# Patient Record
Sex: Male | Born: 1945 | Race: Black or African American | Hispanic: No | Marital: Married | Smoking: Never smoker
Health system: Southern US, Community
[De-identification: ages and names within clinical notes are randomized; demographics above are authoritative.]

## PROBLEM LIST (undated history)

## (undated) DIAGNOSIS — M109 Gout, unspecified: Secondary | ICD-10-CM

## (undated) DIAGNOSIS — I10 Essential (primary) hypertension: Secondary | ICD-10-CM

## (undated) DIAGNOSIS — K219 Gastro-esophageal reflux disease without esophagitis: Secondary | ICD-10-CM

## (undated) DIAGNOSIS — M1990 Unspecified osteoarthritis, unspecified site: Secondary | ICD-10-CM

## (undated) DIAGNOSIS — N183 Chronic kidney disease, stage 3 unspecified: Secondary | ICD-10-CM

## (undated) DIAGNOSIS — N400 Enlarged prostate without lower urinary tract symptoms: Secondary | ICD-10-CM

## (undated) DIAGNOSIS — I2510 Atherosclerotic heart disease of native coronary artery without angina pectoris: Secondary | ICD-10-CM

## (undated) DIAGNOSIS — E785 Hyperlipidemia, unspecified: Secondary | ICD-10-CM

## (undated) DIAGNOSIS — J42 Unspecified chronic bronchitis: Secondary | ICD-10-CM

## (undated) DIAGNOSIS — H409 Unspecified glaucoma: Secondary | ICD-10-CM

## (undated) DIAGNOSIS — E119 Type 2 diabetes mellitus without complications: Secondary | ICD-10-CM

## (undated) HISTORY — PX: EXPLORATION POST OPERATIVE OPEN HEART: SHX5061

## (undated) HISTORY — PX: CORONARY ARTERY BYPASS GRAFT: SHX141

## (undated) HISTORY — PX: PACEMAKER IMPLANT: EP1218

---

## 2008-09-26 LAB — METABOLIC PANEL, COMPREHENSIVE
A-G Ratio: 0.9 (ref 0.8–1.7)
ALT (SGPT): 37 U/L (ref 30–65)
AST (SGOT): 12 U/L — ABNORMAL LOW (ref 15–37)
Albumin: 3.9 g/dL (ref 3.4–5.0)
Alk. phosphatase: 74 U/L (ref 50–136)
Anion gap: 9 mmol/L (ref 5–15)
BUN/Creatinine ratio: 14 (ref 12–20)
BUN: 19 MG/DL — ABNORMAL HIGH (ref 7–18)
Bilirubin, total: 0.5 MG/DL (ref 0.1–0.9)
CO2: 30 MMOL/L (ref 21–32)
Calcium: 9.6 MG/DL (ref 8.4–10.4)
Chloride: 100 MMOL/L (ref 100–108)
Creatinine: 1.4 MG/DL — ABNORMAL HIGH (ref 0.6–1.3)
GFR est AA: >60 mL/min/{1.73_m2} (ref >60)
GFR est non-AA: 54 mL/min/{1.73_m2} — ABNORMAL LOW (ref >60)
Globulin: 4.2 g/dL — ABNORMAL HIGH (ref 2.0–4.0)
Glucose: 182 MG/DL — ABNORMAL HIGH (ref 74–99)
Potassium: 3.9 MMOL/L (ref 3.5–5.5)
Protein, total: 8.1 g/dL (ref 6.4–8.2)
Sodium: 139 MMOL/L (ref 136–145)

## 2008-09-26 LAB — CBC WITH AUTOMATED DIFF
ABS. LYMPHOCYTES: 2.3 10*3/uL (ref 0.8–3.5)
ABS. MONOCYTES: 1 10*3/uL (ref 0–1.0)
ABS. NEUTROPHILS: 8.9 10*3/uL — ABNORMAL HIGH (ref 1.8–8.0)
BASOPHILS: 1 % (ref 0–3)
EOSINOPHILS: 0 % (ref 0–5)
HCT: 42.7 % (ref 37.0–49.0)
HGB: 14.2 g/dL (ref 13.0–16.0)
LYMPHOCYTES: 19 % — ABNORMAL LOW (ref 20–51)
MCH: 29.6 PG (ref 25.0–35.0)
MCHC: 33.3 g/dL (ref 31.0–37.0)
MCV: 88.7 FL (ref 78.0–98.0)
MONOCYTES: 9 % (ref 2–9)
MPV: 8.5 FL (ref 7.4–10.4)
NEUTROPHILS: 71 % (ref 42–75)
PLATELET: 210 10*3/uL (ref 130–400)
RBC: 4.81 M/uL (ref 4.50–5.30)
RDW: 14.2 % (ref 11.5–14.5)
WBC: 12.3 10*3/uL (ref 4.5–13.0)

## 2008-09-26 LAB — URINALYSIS W/ RFLX MICROSCOPIC
Bilirubin: NEGATIVE
Glucose: NEGATIVE MG/DL
Ketone: NEGATIVE MG/DL
Leukocyte Esterase: NEGATIVE
Nitrites: NEGATIVE
Protein: NEGATIVE MG/DL
Specific gravity: 1.025 (ref 1.003–1.030)
Urobilinogen: 0.2 EU/DL (ref 0.2–1.0)
pH (UA): 6 (ref 5.0–8.0)

## 2008-09-26 LAB — URINE MICROSCOPIC ONLY
RBC: 0 /HPF (ref 0–5)
WBC: NEGATIVE /HPF (ref 0–5)

## 2008-09-26 LAB — GLUCOSE, POC: Glucose (POC): 137 mg/dL — ABNORMAL HIGH (ref 70–110)

## 2008-09-26 LAB — ACETONE/KETONE, QL: Acetone/Ketone serum, QL.: NEGATIVE

## 2008-09-27 LAB — GLUCOSE, POC: Glucose (POC): 195 mg/dL — ABNORMAL HIGH (ref 70–110)

## 2010-10-08 LAB — POTASSIUM: Potassium: 4.1 MMOL/L (ref 3.5–5.5)

## 2013-08-21 LAB — AMB EXT PSA

## 2013-08-21 LAB — AMB EXT LDL-C

## 2014-03-12 NOTE — Progress Notes (Signed)
Abstraction encounter from 10/16/13 entered in error.

## 2014-03-12 NOTE — Addendum Note (Signed)
Addended by: Tiffany Kocher on: 03/12/2014 03:23 PM      Modules accepted: Orders, Medications

## 2014-12-08 ENCOUNTER — Inpatient Hospital Stay
Admit: 2014-12-08 | Discharge: 2014-12-09 | Disposition: A | Discharge status: Home / Self Care | Payer: MEDICARE | Attending: Emergency Medicine

## 2014-12-08 DIAGNOSIS — E1165 Type 2 diabetes mellitus with hyperglycemia: Secondary | ICD-10-CM

## 2014-12-08 LAB — CBC WITH AUTOMATED DIFF
ABS. BASOPHILS: 0 10*3/uL (ref 0.0–0.06)
ABS. EOSINOPHILS: 0 10*3/uL (ref 0.0–0.4)
ABS. LYMPHOCYTES: 1.4 10*3/uL (ref 0.9–3.6)
ABS. MONOCYTES: 0.8 10*3/uL (ref 0.05–1.2)
ABS. NEUTROPHILS: 18 10*3/uL — ABNORMAL HIGH (ref 1.8–8.0)
BASOPHILS: 0 % (ref 0–2)
EOSINOPHILS: 0 % (ref 0–5)
HCT: 40 % (ref 36.0–48.0)
HGB: 13.6 g/dL (ref 13.0–16.0)
LYMPHOCYTES: 7 % — ABNORMAL LOW (ref 21–52)
MCH: 29.2 PG (ref 24.0–34.0)
MCHC: 34 g/dL (ref 31.0–37.0)
MCV: 85.8 FL (ref 74.0–97.0)
MONOCYTES: 4 % (ref 3–10)
MPV: 9.7 FL (ref 9.2–11.8)
NEUTROPHILS: 89 % — ABNORMAL HIGH (ref 40–73)
PLATELET: 243 10*3/uL (ref 135–420)
RBC: 4.66 M/uL — ABNORMAL LOW (ref 4.70–5.50)
RDW: 14.2 % (ref 11.6–14.5)
WBC: 20.2 10*3/uL — ABNORMAL HIGH (ref 4.6–13.2)

## 2014-12-08 LAB — METABOLIC PANEL, BASIC
Anion gap: 12 mmol/L (ref 3.0–18)
BUN/Creatinine ratio: 14 (ref 12–20)
BUN: 29 MG/DL — ABNORMAL HIGH (ref 7.0–18)
CO2: 21 mmol/L (ref 21–32)
Calcium: 9.5 MG/DL (ref 8.5–10.1)
Chloride: 100 mmol/L (ref 100–108)
Creatinine: 2.03 MG/DL — ABNORMAL HIGH (ref 0.6–1.3)
GFR est AA: 40 mL/min/{1.73_m2} — ABNORMAL LOW (ref >60)
GFR est non-AA: 33 mL/min/{1.73_m2} — ABNORMAL LOW (ref >60)
Glucose: 330 mg/dL — ABNORMAL HIGH (ref 74–99)
Potassium: 4.7 mmol/L (ref 3.5–5.5)
Sodium: 133 mmol/L — ABNORMAL LOW (ref 136–145)

## 2014-12-08 LAB — CARDIAC PANEL,(CK, CKMB & TROPONIN)
CK - MB: 1.5 ng/ml (ref 0.5–3.6)
CK-MB Index: 1.8 % (ref 0.0–4.0)
CK: 84 U/L (ref 39–308)
Troponin-I, QT: 0.02 NG/ML (ref 0.00–0.06)

## 2014-12-08 LAB — GLUCOSE, POC
Glucose (POC): 183 mg/dL — ABNORMAL HIGH (ref 70–110)
Glucose (POC): 267 mg/dL — ABNORMAL HIGH (ref 70–110)
Glucose (POC): 304 mg/dL — ABNORMAL HIGH (ref 70–110)

## 2014-12-08 MED ORDER — INSULIN REGULAR HUMAN 100 UNIT/ML INJECTION
100 unit/mL | INTRAMUSCULAR | Status: AC
Start: 2014-12-08 — End: 2014-12-08
  Administered 2014-12-08: 22:00:00 via INTRAVENOUS

## 2014-12-08 MED ORDER — SODIUM CHLORIDE 0.9% BOLUS IV
0.9 % | Freq: Once | INTRAVENOUS | Status: AC
Start: 2014-12-08 — End: 2014-12-08
  Administered 2014-12-08: 22:00:00 via INTRAVENOUS

## 2014-12-08 MED FILL — SODIUM CHLORIDE 0.9 % IV: INTRAVENOUS | Qty: 1000

## 2014-12-08 MED FILL — HUMULIN R REGULAR U-100 INSULIN 100 UNIT/ML INJECTION SOLUTION: 100 unit/mL | INTRAMUSCULAR | Qty: 3

## 2014-12-08 NOTE — ED Provider Notes (Signed)
HPI Comments:   5:12 PM  Martin DalesRobert E Decandia is a 69 y.o. male with hx of DM, HTN, chronic kidney disease, and CHF who presents to the ED via spouse C/O high blood sugar. Pt has had a persistent cough since January and recently was prescribed a steroid by his Pulmonologist (Dr. Pleas Patriciarie) to treat this. After taking 6 tablets 2x per day for 5 days pt has noticed his blood sugar has been elevated. Pt denies any other Sx or complaints.      Written by Buelah Manisaniel Hoock, ED Scribe, as dictated by Chelsea PrimusMark Jarnell Cordaro, MD               Patient is a 69 y.o. male presenting with hyperglycemia. The history is provided by the patient and the spouse. No language interpreter was used.   High Blood Sugar   This is a new problem. The current episode started more than 2 days ago. The problem occurs constantly. Pertinent negatives include no fever, no diarrhea, no nausea, no vomiting, no dysuria, no headaches, no arthralgias, no myalgias and no chest pain.        Past Medical History:   Diagnosis Date   ??? Hypertension    ??? Diabetes (HCC)    ??? Heart failure (HCC)    ??? Glaucoma    ??? Chronic kidney disease        Past Surgical History:   Procedure Laterality Date   ??? Hx coronary artery bypass graft           History reviewed. No pertinent family history.    History     Social History   ??? Marital Status: MARRIED     Spouse Name: N/A   ??? Number of Children: N/A   ??? Years of Education: N/A     Occupational History   ??? Not on file.     Social History Main Topics   ??? Smoking status: Never Smoker    ??? Smokeless tobacco: Not on file   ??? Alcohol Use: 0.0 oz/week     0 Standard drinks or equivalent per week   ??? Drug Use: No   ??? Sexual Activity: Not on file     Other Topics Concern   ??? Not on file     Social History Narrative           ALLERGIES: Pcn      Review of Systems   Constitutional: Negative for fever and fatigue.   HENT: Negative for rhinorrhea and sore throat.    Respiratory: Positive for cough. Negative for shortness of breath.     Cardiovascular: Negative for chest pain and palpitations.   Gastrointestinal: Negative for nausea, vomiting, abdominal pain and diarrhea.   Genitourinary: Negative for dysuria and difficulty urinating.   Musculoskeletal: Negative for myalgias and arthralgias.   Skin: Negative for color change and rash.   Neurological: Negative for light-headedness and headaches.   All other systems reviewed and are negative.      Filed Vitals:    12/08/14 1645 12/08/14 1700 12/08/14 1715 12/08/14 1730   BP: 148/76 142/75 148/76 135/75   Pulse: 98 77 75 72   Temp:       Resp: 19 21 11 19    Height:       Weight:       SpO2: 98% 96% 99% 98%            Physical Exam   Constitutional: He is oriented to person, place, and time. He  appears well-developed and well-nourished. No distress.   HENT:   Head: Normocephalic and atraumatic. Head is without right periorbital erythema and without left periorbital erythema.   Right Ear: External ear normal. No drainage or swelling. Tympanic membrane is not perforated, not erythematous and not bulging.   Left Ear: External ear normal. No drainage or swelling. Tympanic membrane is not perforated, not erythematous and not bulging.   Nose: Nose normal. No mucosal edema or rhinorrhea. Right sinus exhibits no maxillary sinus tenderness and no frontal sinus tenderness. Left sinus exhibits no maxillary sinus tenderness and no frontal sinus tenderness.   Mouth/Throat: Uvula is midline, oropharynx is clear and moist and mucous membranes are normal. No oral lesions. No trismus in the jaw. No dental abscesses or uvula swelling. No oropharyngeal exudate, posterior oropharyngeal edema, posterior oropharyngeal erythema or tonsillar abscesses.   Eyes: Conjunctivae are normal. Right eye exhibits no discharge. Left eye exhibits no discharge. No scleral icterus.   Neck: Normal range of motion. Neck supple.   Cardiovascular: Normal rate, regular rhythm, normal heart sounds and  intact distal pulses.  Exam reveals no gallop and no friction rub.    No murmur heard.  Pulmonary/Chest: Effort normal and breath sounds normal. No accessory muscle usage. No tachypnea. No respiratory distress. He has no decreased breath sounds. He has no wheezes. He has no rhonchi. He has no rales.   Abdominal: Soft. Bowel sounds are normal. He exhibits no distension. There is no tenderness.   Musculoskeletal: Normal range of motion. He exhibits no edema or tenderness.   Lymphadenopathy:     He has no cervical adenopathy.   Neurological: He is alert and oriented to person, place, and time.   Skin: Skin is warm and dry. He is not diaphoretic.   Psychiatric: He has a normal mood and affect. Judgment normal.   Nursing note and vitals reviewed.     RESULTS    EKG FINDING  4:33 PM  Rhythm: sinus rhythm with occasional PVCs; rate: 80 bpm; Other findings: RBBB; no STEMI  Read by Chelsea Primus, MD at 4:33 PM.  Written by Buelah Manis, ED Scribe, as dictated by Chelsea Primus, MD.     No orders to display       Labs Reviewed   CBC WITH AUTOMATED DIFF - Abnormal; Notable for the following:     WBC 20.2 (*)     RBC 4.66 (*)     NEUTROPHILS 89 (*)     LYMPHOCYTES 7 (*)     ABS. NEUTROPHILS 18.0 (*)     All other components within normal limits   METABOLIC PANEL, BASIC - Abnormal; Notable for the following:     Sodium 133 (*)     Glucose 330 (*)     BUN 29 (*)     Creatinine 2.03 (*)     GFR est AA 40 (*)     GFR est non-AA 33 (*)     All other components within normal limits   GLUCOSE, POC - Abnormal; Notable for the following:     Glucose (POC) 304 (*)     All other components within normal limits   GLUCOSE, POC - Abnormal; Notable for the following:     Glucose (POC) 267 (*)     All other components within normal limits   CARDIAC PANEL,(CK, CKMB & TROPONIN)   POC GLUCOSE   POC GLUCOSE       Recent Results (from the past 12 hour(s))   EKG,  12 LEAD, INITIAL    Collection Time: 12/08/14  4:33 PM   Result Value Ref Range     Ventricular Rate 80 BPM    Atrial Rate 80 BPM    P-R Interval 150 ms    QRS Duration 144 ms    Q-T Interval 416 ms    QTC Calculation (Bezet) 479 ms    Calculated P Axis 28 degrees    Calculated R Axis -2 degrees    Calculated T Axis 31 degrees    Diagnosis       Sinus rhythm with occasional premature ventricular complexes  Right bundle branch block  Abnormal ECG  When compared with ECG of 26-Sep-2008 11:40,  premature ventricular complexes are now present     CBC WITH AUTOMATED DIFF    Collection Time: 12/08/14  4:35 PM   Result Value Ref Range    WBC 20.2 (H) 4.6 - 13.2 K/uL    RBC 4.66 (L) 4.70 - 5.50 M/uL    HGB 13.6 13.0 - 16.0 g/dL    HCT 16.1 09.6 - 04.5 %    MCV 85.8 74.0 - 97.0 FL    MCH 29.2 24.0 - 34.0 PG    MCHC 34.0 31.0 - 37.0 g/dL    RDW 40.9 81.1 - 91.4 %    PLATELET 243 135 - 420 K/uL    MPV 9.7 9.2 - 11.8 FL    NEUTROPHILS 89 (H) 40 - 73 %    LYMPHOCYTES 7 (L) 21 - 52 %    MONOCYTES 4 3 - 10 %    EOSINOPHILS 0 0 - 5 %    BASOPHILS 0 0 - 2 %    ABS. NEUTROPHILS 18.0 (H) 1.8 - 8.0 K/UL    ABS. LYMPHOCYTES 1.4 0.9 - 3.6 K/UL    ABS. MONOCYTES 0.8 0.05 - 1.2 K/UL    ABS. EOSINOPHILS 0.0 0.0 - 0.4 K/UL    ABS. BASOPHILS 0.0 0.0 - 0.06 K/UL    DF AUTOMATED     METABOLIC PANEL, BASIC    Collection Time: 12/08/14  4:35 PM   Result Value Ref Range    Sodium 133 (L) 136 - 145 mmol/L    Potassium 4.7 3.5 - 5.5 mmol/L    Chloride 100 100 - 108 mmol/L    CO2 21 21 - 32 mmol/L    Anion gap 12 3.0 - 18 mmol/L    Glucose 330 (H) 74 - 99 mg/dL    BUN 29 (H) 7.0 - 18 MG/DL    Creatinine 7.82 (H) 0.6 - 1.3 MG/DL    BUN/Creatinine ratio 14 12 - 20      GFR est AA 40 (L) >60 ml/min/1.98m2    GFR est non-AA 33 (L) >60 ml/min/1.74m2    Calcium 9.5 8.5 - 10.1 MG/DL   CARDIAC PANEL,(CK, CKMB & TROPONIN)    Collection Time: 12/08/14  4:35 PM   Result Value Ref Range    CK 84 39 - 308 U/L    CK - MB 1.5 0.5 - 3.6 ng/ml    CK-MB Index 1.8 0.0 - 4.0 %    Troponin-I, Qt. 0.02 0.00 - 0.06 NG/ML   GLUCOSE, POC     Collection Time: 12/08/14  4:47 PM   Result Value Ref Range    Glucose (POC) 304 (H) 70 - 110 mg/dL   GLUCOSE, POC    Collection Time: 12/08/14  6:16 PM   Result Value Ref Range    Glucose (POC)  267 (H) 70 - 110 mg/dL        MDM  Number of Diagnoses or Management Options  Diagnosis management comments: differential diagnosis: DKA, hyperglycemia, sepsis       Amount and/or Complexity of Data Reviewed  Clinical lab tests: ordered and reviewed  Tests in the medicine section of CPT??: reviewed and ordered (EKG)  Independent visualization of images, tracings, or specimens: yes (EKG)      MEDICATIONS GIVEN    Medications   insulin regular (NOVOLIN R, HUMULIN R) injection 5 Units (5 Units IntraVENous Given 12/08/14 1820)   sodium chloride 0.9 % bolus infusion 1,000 mL (1,000 mL IntraVENous New Bag 12/08/14 1818)        Procedures  PROGRESS NOTE:  5:12 PM   Initial assessment performed.  Written by Buelah Manis, ED Scribe, as dictated by Chelsea Primus, MD    DISCUSSION:  6:30 PM  Pt is on Predisone and has an elevated blood sugar. He needs to f/u with his PCP on Monday to adjust is medication regiment. Elevated WBC is most likely secondary to Prednisone use.    Written by Buelah Manis, ED Scribe, as dictated by Chelsea Primus, MD.     DISCHARGE NOTE:   6:31 PM   [facility]'s results have been reviewed with patient and/or family. Patient and/or family has been counseled regarding diagnosis, treatment, and plan.  Patient and/or family verbally conveys understanding and agreement of the signs, symptoms, diagnosis, treatment and prognosis and additionally agrees to follow up as discussed.  Patient and/or family also agrees with the care-plan and conveys that all of his/her questions have been answered.  I have also provided discharge instructions for the patient and/or family that include: educational information regarding their diagnosis and treatment, and list of reasons why they would want to  return to the ED prior to their follow-up appointment, should patient's condition change.     CLINICAL IMPRESSION    1. Hyperglycemia         AFTER VISIT PLAN    Follow-up Information     Follow up With Details Comments Contact Info    [facility] Schedule an appointment as soon as possible for a visit in 2 days for follow up with PCP [address] [place] [id number]  [place] [address]  [phone]    [facility] EMERGENCY DEPT  As needed, If symptoms worsen [address]  [place] [address]  [phone]          There are no discharge medications for this patient.      This note is prepared by Buelah Manis, acting as Chelsea Primus, MD      Chelsea Primus, MD: The scribe's documentation has been prepared under my direction and personally reviewed by me in its entirety. I confirm that the note above accurately reflects all work, treatment, procedures, and medical decision making performed by me.

## 2014-12-08 NOTE — ED Notes (Signed)
Bedside report to April Zoss, RN

## 2014-12-08 NOTE — ED Notes (Addendum)
Patient reports he was seen by Dr. Pleas Patriciarie, pulmonologist  this week for respiratory problems and started on prednisone 4mg  tapering dose. C/o high blood sugar of 375. During triage, patient c/o intermittent chest pain. Sepsis Screening completed    (  )Patient meets SIRS criteria.  (x  )Patient does not meet SIRS criteria.      SIRS Criteria is achieved when two or more of the following are present  ? Temperature < 96.8??F (36??C) or > 100.9??F (38.3??C)  ? Heart Rate > 90 beats per minute  ? Respiratory Rate > 20 beats per minute  ? WBC count > 12,000 or <4,000 or > 10% bands      (x  )Patient has a suspected source of infection.  (  )Patient does not have a suspected source of infection.

## 2014-12-08 NOTE — ED Notes (Signed)
I have reviewed discharge instructions with the patient and spouse.  The patient and spouse verbalized understanding.  Patient armband removed and shredded  Pt out of ER with spouse.

## 2014-12-08 NOTE — ED Notes (Signed)
fsbs completed x 2 295mg /dl and 811305 mg/dl; pt then used his own and reading was 324 mg/dl with his machine from home

## 2014-12-10 LAB — EKG, 12 LEAD, INITIAL
Atrial Rate: 80 {beats}/min
Calculated P Axis: 28 degrees
Calculated R Axis: -2 degrees
Calculated T Axis: 31 degrees
P-R Interval: 150 ms
Q-T Interval: 416 ms
QRS Duration: 144 ms
QTC Calculation (Bezet): 479 ms
Ventricular Rate: 80 {beats}/min

## 2021-08-17 ENCOUNTER — Encounter (HOSPITAL_COMMUNITY): Payer: Self-pay

## 2021-08-17 ENCOUNTER — Emergency Department (HOSPITAL_COMMUNITY): Payer: Medicare Other

## 2021-08-17 ENCOUNTER — Other Ambulatory Visit: Payer: Self-pay

## 2021-08-17 ENCOUNTER — Emergency Department (HOSPITAL_COMMUNITY)
Admission: EM | Admit: 2021-08-17 | Discharge: 2021-08-17 | Disposition: A | Discharge status: Home / Self Care | Payer: Medicare Other | Attending: Emergency Medicine | Admitting: Emergency Medicine

## 2021-08-17 DIAGNOSIS — R0602 Shortness of breath: Secondary | ICD-10-CM | POA: Diagnosis not present

## 2021-08-17 DIAGNOSIS — E1122 Type 2 diabetes mellitus with diabetic chronic kidney disease: Secondary | ICD-10-CM | POA: Diagnosis not present

## 2021-08-17 DIAGNOSIS — N189 Chronic kidney disease, unspecified: Secondary | ICD-10-CM | POA: Insufficient documentation

## 2021-08-17 DIAGNOSIS — Z951 Presence of aortocoronary bypass graft: Secondary | ICD-10-CM | POA: Insufficient documentation

## 2021-08-17 DIAGNOSIS — I2510 Atherosclerotic heart disease of native coronary artery without angina pectoris: Secondary | ICD-10-CM | POA: Diagnosis not present

## 2021-08-17 DIAGNOSIS — Z20822 Contact with and (suspected) exposure to covid-19: Secondary | ICD-10-CM | POA: Insufficient documentation

## 2021-08-17 DIAGNOSIS — I129 Hypertensive chronic kidney disease with stage 1 through stage 4 chronic kidney disease, or unspecified chronic kidney disease: Secondary | ICD-10-CM | POA: Insufficient documentation

## 2021-08-17 DIAGNOSIS — J40 Bronchitis, not specified as acute or chronic: Secondary | ICD-10-CM

## 2021-08-17 LAB — RESP PANEL BY RT-PCR (FLU A&B, COVID) ARPGX2
Influenza A by PCR: NEGATIVE
Influenza B by PCR: NEGATIVE
SARS Coronavirus 2 by RT PCR: NEGATIVE

## 2021-08-17 LAB — CBC WITH DIFFERENTIAL/PLATELET
Abs Immature Granulocytes: 0.02 10*3/uL (ref 0.00–0.07)
Basophils Absolute: 0 10*3/uL (ref 0.0–0.1)
Basophils Relative: 0 %
Eosinophils Absolute: 0.4 10*3/uL (ref 0.0–0.5)
Eosinophils Relative: 4 %
HCT: 36.4 % — ABNORMAL LOW (ref 39.0–52.0)
Hemoglobin: 12.4 g/dL — ABNORMAL LOW (ref 13.0–17.0)
Immature Granulocytes: 0 %
Lymphocytes Relative: 17 %
Lymphs Abs: 1.7 10*3/uL (ref 0.7–4.0)
MCH: 30.7 pg (ref 26.0–34.0)
MCHC: 34.1 g/dL (ref 30.0–36.0)
MCV: 90.1 fL (ref 80.0–100.0)
Monocytes Absolute: 0.9 10*3/uL (ref 0.1–1.0)
Monocytes Relative: 10 %
Neutro Abs: 6.6 10*3/uL (ref 1.7–7.7)
Neutrophils Relative %: 69 %
Platelets: 188 10*3/uL (ref 150–400)
RBC: 4.04 MIL/uL — ABNORMAL LOW (ref 4.22–5.81)
RDW: 14.4 % (ref 11.5–15.5)
WBC: 9.6 10*3/uL (ref 4.0–10.5)
nRBC: 0 % (ref 0.0–0.2)

## 2021-08-17 LAB — BASIC METABOLIC PANEL
Anion gap: 8 (ref 5–15)
BUN: 30 mg/dL — ABNORMAL HIGH (ref 8–23)
CO2: 20 mmol/L — ABNORMAL LOW (ref 22–32)
Calcium: 9.2 mg/dL (ref 8.9–10.3)
Chloride: 107 mmol/L (ref 98–111)
Creatinine, Ser: 1.94 mg/dL — ABNORMAL HIGH (ref 0.61–1.24)
GFR, Estimated: 35 mL/min — ABNORMAL LOW (ref >60)
Glucose, Bld: 169 mg/dL — ABNORMAL HIGH (ref 70–99)
Potassium: 4.6 mmol/L (ref 3.5–5.1)
Sodium: 135 mmol/L (ref 135–145)

## 2021-08-17 LAB — TROPONIN I (HIGH SENSITIVITY)
Troponin I (High Sensitivity): 9 ng/L (ref <18)
Troponin I (High Sensitivity): 9 ng/L (ref <18)

## 2021-08-17 LAB — BRAIN NATRIURETIC PEPTIDE: B Natriuretic Peptide: 206.2 pg/mL — ABNORMAL HIGH (ref 0.0–100.0)

## 2021-08-17 MED ORDER — PREDNISONE 10 MG PO TABS: 40.0000 mg | ORAL_TABLET | Freq: Every day | ORAL | 0 refills | Status: DC

## 2021-08-17 MED ORDER — BENZONATATE 100 MG PO CAPS
100.0000 mg | ORAL_CAPSULE | Freq: Once | ORAL | Status: AC
  Administered 2021-08-17: 100 mg via ORAL
  Filled 2021-08-17: qty 1

## 2021-08-17 MED ORDER — IPRATROPIUM-ALBUTEROL 0.5-2.5 (3) MG/3ML IN SOLN
3.0000 mL | Freq: Once | RESPIRATORY_TRACT | Status: AC
  Administered 2021-08-17: 3 mL via RESPIRATORY_TRACT
  Filled 2021-08-17: qty 3

## 2021-08-17 MED ORDER — ALBUTEROL SULFATE HFA 108 (90 BASE) MCG/ACT IN AERS
2.0000 | INHALATION_SPRAY | Freq: Once | RESPIRATORY_TRACT | Status: AC
  Administered 2021-08-17: 2 via RESPIRATORY_TRACT
  Filled 2021-08-17: qty 6.7

## 2021-08-17 MED ORDER — METHYLPREDNISOLONE SODIUM SUCC 125 MG IJ SOLR
125.0000 mg | Freq: Once | INTRAMUSCULAR | Status: AC
  Administered 2021-08-17: 125 mg via INTRAVENOUS
  Filled 2021-08-17: qty 2

## 2021-08-17 MED ORDER — BENZONATATE 100 MG PO CAPS: 100.0000 mg | ORAL_CAPSULE | Freq: Three times a day (TID) | ORAL | 0 refills | Status: DC

## 2021-08-17 MED ORDER — ALBUTEROL SULFATE (2.5 MG/3ML) 0.083% IN NEBU
5.0000 mg | INHALATION_SOLUTION | Freq: Once | RESPIRATORY_TRACT | Status: AC
  Administered 2021-08-17: 5 mg via RESPIRATORY_TRACT
  Filled 2021-08-17: qty 6

## 2021-08-17 NOTE — ED Provider Notes (Signed)
I provided a substantive portion of the care of this patient.  I personally performed the entirety of the medical decision making for this encounter.  EKG Interpretation  Date/Time:  Sunday August 17 2021 11:19:54 EST Ventricular Rate:  78 PR Interval:  157 QRS Duration: 149 QT Interval:  428 QTC Calculation: 488 R Axis:   -50 Text Interpretation: Sinus rhythm Atrial premature complex RBBB and LAFB Confirmed by Lacretia Leigh ([id number]) on 08/17/2021 12:48:65 PM   76 year old male presents with shortness of breath.  He has wheezing on exam here.  Chest x-ray without infiltrate or CHF.  Suspect he has bronchitis.  He is COVID and flu negative.  Will treat with steroids and albuterol on discharge   Lacretia Leigh, MD 08/17/21 1419

## 2021-08-17 NOTE — ED Provider Notes (Signed)
[facility] DEPT Provider Note   CSN: [id number] Arrival date & time: 08/17/21  1108     History  Chief Complaint  Patient presents with   Shortness of Breath   Cough    Jacob Guerrero is a 76 y.o. male.   Shortness of Breath Associated symptoms: chest pain, cough and wheezing   Associated symptoms: no fever and no vomiting   Cough Associated symptoms: chest pain, shortness of breath and wheezing   Associated symptoms: no fever    This is a male presenting with cough and shortness of breath.  Reports he felt cough and shortness of breath about 4 weeks ago, it resolved on its own with medicine he got from the [place].  He started feeling bad again yesterday including short of breath at night, coughing, pain in his chest when he breathes and coughs.  Pain in his chest is dull, only feels it with inspiration, expiration and coughing.  It does not radiate elsewhere, no associated nausea or vomiting.  Patient is auditorily wheezing, does not have a history of COPD or asthma per the patient.  PMH: CKD, CAD, HTN, Gout, HLD and DM who came to establish care for CKD.   Home Medications Prior to Admission medications   Not on File      Allergies    Penicillins    Review of Systems   Review of Systems  Constitutional:  Negative for fever.  Respiratory:  Positive for cough, shortness of breath and wheezing.   Cardiovascular:  Positive for chest pain. Negative for palpitations and leg swelling.  Gastrointestinal:  Negative for nausea and vomiting.  Genitourinary:  Negative for dysuria.  Musculoskeletal:  Negative for gait problem.  Skin:  Negative for wound.  Neurological:  Negative for syncope.   Physical Exam Updated Vital Signs BP 137/72 (BP Location: Left Arm)    Pulse 84    Temp 97.7 F (36.5 C) (Oral)    Resp 16    SpO2 97%  Physical Exam Vitals and nursing note reviewed. Exam conducted with a chaperone present.  Constitutional:       Appearance: Normal appearance.  HENT:     Head: Normocephalic and atraumatic.  Eyes:     General: No scleral icterus.       Right eye: No discharge.        Left eye: No discharge.     Extraocular Movements: Extraocular movements intact.     Pupils: Pupils are equal, round, and reactive to light.  Cardiovascular:     Rate and Rhythm: Normal rate and regular rhythm.     Pulses: Normal pulses.     Heart sounds: Normal heart sounds. No murmur heard.   No friction rub. No gallop.  Pulmonary:     Effort: Pulmonary effort is normal. No respiratory distress.     Breath sounds: Wheezing present.     Comments: Diffuse inspiratory and expiratory wheezes across all lung fields. Abdominal:     General: Abdomen is flat. Bowel sounds are normal. There is no distension.     Palpations: Abdomen is soft.     Tenderness: There is no abdominal tenderness.  Skin:    General: Skin is warm and dry.     Coloration: Skin is not jaundiced.  Neurological:     Mental Status: He is alert. Mental status is at baseline.     Coordination: Coordination normal.   ED Results / Procedures / Treatments   Labs (all labs ordered  are listed, but only abnormal results are displayed) Labs Reviewed  RESP PANEL BY RT-PCR (FLU A&B, COVID) [id number]  BASIC METABOLIC PANEL  BRAIN NATRIURETIC PEPTIDE  CBC WITH DIFFERENTIAL/PLATELET  TROPONIN I (HIGH SENSITIVITY)    EKG None  Radiology No results found.  Procedures Procedures    Medications Ordered in ED Medications  benzonatate (TESSALON) capsule 100 mg (has no administration in time range)  ipratropium-albuterol (DUONEB) 0.5-2.5 (3) MG/3ML nebulizer solution 3 mL (has no administration in time range)    ED Course/ Medical Decision Making/ A&P                           Medical Decision Making Jacob Guerrero is a 76 y.o. male with a past medical history of CKD, CAD, HTN, Gout, HLD and DM.   Patient does have expiratory wheezing noted on exam, this  improved with multiple duo nebs and albuterol treatment he is not tachypneic or tachycardic.  Delta troponin negative, doubt this is ACS.  EKG is also without any ST elevations or depressions or changes.  Radiograph does not show any signs of pneumonia.    He does not have any signs of respiratory distress, I did briefly consider PE but given stable vital signs, I suspect the chest pain is secondary more to the bronchitis then a PE presentation.    His lung sounds improved after treatment, he was also given Solu-Medrol.  Suspect this is an acute bronchitis presentation, he does not have any history of COPD or asthma.  Will discharge with short course of steroids and have him follow-up with his primary care provider.Patient case discussed, he was discharged stable condition.  Discussed HPI, physical exam and plan of care for this patient with attending Lacretia Leigh. The attending physician evaluated this patient as part of a shared visit and agrees with plan of care.   Amount and/or Complexity of Data Reviewed External Data Reviewed: labs, radiology and notes.    Details: Pt has hx of CKD and was following up with Nephrologist at [facility]. But since his nephrologist got deployed about a yr or so ago, has not seen any on. Recent lab shows Cr of 1.4-1.7. Pt has well controlled HTN. He has DM and reports his HbA1C recently went up to ~7.4%. He had cabg in 1996.  CABG in 1996, his most recent echo showed EF 15 to 20%. Labs: ordered. Decision-making details documented in ED Course.    Details: He is followed by nephrology, last visit was on 12/12 which showed worsening creatinine function at 1.9, from 1.6 last visit (past baseline 1.4-1.7).  Creatinine today is roughly at his new baseline at 1.96.  Not acutely elevated from prior. Delta troponin negative, not consistent with ACS Radiology: ordered and independent interpretation performed.    Details: No underlying pneumonia ECG/medicine tests:  ordered.    Details: NSR  Risk OTC drugs. Prescription drug management. Decision regarding hospitalization. Risk Details: Patient is getting up in the area, lives at home and girlfriend and has family in the area.  He is appropriate for outpatient follow-up.            Final Clinical Impression(s) / ED Diagnoses Final diagnoses:  None    Rx / DC Orders ED Discharge Orders     None         Sherrill Raring, Hershal Coria 08/17/21 1532    Lacretia Leigh, MD 08/25/21 1022

## 2021-08-17 NOTE — ED Triage Notes (Signed)
Pt states he has been coughing x4 weeks, but last night started feeling SOB. Pt states his chest feels sore from all the coughing, denies chest pain. Pt denies fever. Pt denies N/V/D.

## 2021-08-17 NOTE — Discharge Instructions (Addendum)
Your work-up today was reassuring, did not show any signs of pneumonia or anything that would require antibiotic treatment. Please take prednisone 40 mg daily for the next 5 days.  Take 20 in the morning and 20 in the evening.  Follow-up with the [place] clinic next week, return to the ED if your symptoms worsen.

## 2022-10-09 ENCOUNTER — Inpatient Hospital Stay (HOSPITAL_COMMUNITY)
Admission: EM | Admit: 2022-10-09 | Discharge: 2022-10-20 | DRG: 330 | Disposition: A | Discharge status: Home / Self Care | Payer: Medicare Other | Attending: Internal Medicine | Admitting: Internal Medicine

## 2022-10-09 ENCOUNTER — Encounter (HOSPITAL_COMMUNITY): Payer: Self-pay

## 2022-10-09 ENCOUNTER — Emergency Department (HOSPITAL_COMMUNITY): Payer: Medicare Other

## 2022-10-09 ENCOUNTER — Other Ambulatory Visit: Payer: Self-pay

## 2022-10-09 ENCOUNTER — Inpatient Hospital Stay (HOSPITAL_COMMUNITY): Payer: Medicare Other

## 2022-10-09 DIAGNOSIS — Z1152 Encounter for screening for COVID-19: Secondary | ICD-10-CM | POA: Diagnosis not present

## 2022-10-09 DIAGNOSIS — R300 Dysuria: Secondary | ICD-10-CM | POA: Diagnosis not present

## 2022-10-09 DIAGNOSIS — K9189 Other postprocedural complications and disorders of digestive system: Secondary | ICD-10-CM | POA: Diagnosis not present

## 2022-10-09 DIAGNOSIS — Z951 Presence of aortocoronary bypass graft: Secondary | ICD-10-CM

## 2022-10-09 DIAGNOSIS — E785 Hyperlipidemia, unspecified: Secondary | ICD-10-CM | POA: Diagnosis present

## 2022-10-09 DIAGNOSIS — E872 Acidosis, unspecified: Secondary | ICD-10-CM | POA: Diagnosis present

## 2022-10-09 DIAGNOSIS — N400 Enlarged prostate without lower urinary tract symptoms: Secondary | ICD-10-CM | POA: Diagnosis not present

## 2022-10-09 DIAGNOSIS — K567 Ileus, unspecified: Secondary | ICD-10-CM | POA: Diagnosis not present

## 2022-10-09 DIAGNOSIS — K219 Gastro-esophageal reflux disease without esophagitis: Secondary | ICD-10-CM | POA: Diagnosis not present

## 2022-10-09 DIAGNOSIS — N183 Chronic kidney disease, stage 3 unspecified: Secondary | ICD-10-CM

## 2022-10-09 DIAGNOSIS — E871 Hypo-osmolality and hyponatremia: Secondary | ICD-10-CM | POA: Diagnosis present

## 2022-10-09 DIAGNOSIS — Z880 Allergy status to penicillin: Secondary | ICD-10-CM

## 2022-10-09 DIAGNOSIS — D3A019 Benign carcinoid tumor of the small intestine, unspecified portion: Secondary | ICD-10-CM

## 2022-10-09 DIAGNOSIS — Z888 Allergy status to other drugs, medicaments and biological substances status: Secondary | ICD-10-CM | POA: Diagnosis not present

## 2022-10-09 DIAGNOSIS — I452 Bifascicular block: Secondary | ICD-10-CM | POA: Diagnosis present

## 2022-10-09 DIAGNOSIS — N1832 Chronic kidney disease, stage 3b: Secondary | ICD-10-CM | POA: Diagnosis present

## 2022-10-09 DIAGNOSIS — N179 Acute kidney failure, unspecified: Secondary | ICD-10-CM | POA: Diagnosis present

## 2022-10-09 DIAGNOSIS — E876 Hypokalemia: Secondary | ICD-10-CM | POA: Diagnosis present

## 2022-10-09 DIAGNOSIS — I2510 Atherosclerotic heart disease of native coronary artery without angina pectoris: Secondary | ICD-10-CM | POA: Diagnosis present

## 2022-10-09 DIAGNOSIS — K56600 Partial intestinal obstruction, unspecified as to cause: Secondary | ICD-10-CM | POA: Diagnosis present

## 2022-10-09 DIAGNOSIS — R7989 Other specified abnormal findings of blood chemistry: Secondary | ICD-10-CM | POA: Diagnosis present

## 2022-10-09 DIAGNOSIS — M1A9XX0 Chronic gout, unspecified, without tophus (tophi): Secondary | ICD-10-CM | POA: Diagnosis not present

## 2022-10-09 DIAGNOSIS — E119 Type 2 diabetes mellitus without complications: Secondary | ICD-10-CM

## 2022-10-09 DIAGNOSIS — C7A012 Malignant carcinoid tumor of the ileum: Secondary | ICD-10-CM | POA: Diagnosis not present

## 2022-10-09 DIAGNOSIS — R1310 Dysphagia, unspecified: Secondary | ICD-10-CM | POA: Diagnosis present

## 2022-10-09 DIAGNOSIS — E1122 Type 2 diabetes mellitus with diabetic chronic kidney disease: Secondary | ICD-10-CM | POA: Diagnosis present

## 2022-10-09 DIAGNOSIS — M109 Gout, unspecified: Secondary | ICD-10-CM | POA: Diagnosis present

## 2022-10-09 DIAGNOSIS — I2585 Chronic coronary microvascular dysfunction: Secondary | ICD-10-CM | POA: Diagnosis not present

## 2022-10-09 DIAGNOSIS — I110 Hypertensive heart disease with heart failure: Secondary | ICD-10-CM | POA: Diagnosis not present

## 2022-10-09 DIAGNOSIS — K56609 Unspecified intestinal obstruction, unspecified as to partial versus complete obstruction: Secondary | ICD-10-CM

## 2022-10-09 DIAGNOSIS — I129 Hypertensive chronic kidney disease with stage 1 through stage 4 chronic kidney disease, or unspecified chronic kidney disease: Secondary | ICD-10-CM | POA: Diagnosis present

## 2022-10-09 DIAGNOSIS — I10 Essential (primary) hypertension: Secondary | ICD-10-CM

## 2022-10-09 DIAGNOSIS — K6389 Other specified diseases of intestine: Secondary | ICD-10-CM | POA: Diagnosis not present

## 2022-10-09 DIAGNOSIS — I509 Heart failure, unspecified: Secondary | ICD-10-CM | POA: Diagnosis not present

## 2022-10-09 DIAGNOSIS — Z79899 Other long term (current) drug therapy: Secondary | ICD-10-CM

## 2022-10-09 DIAGNOSIS — E782 Mixed hyperlipidemia: Secondary | ICD-10-CM | POA: Diagnosis not present

## 2022-10-09 HISTORY — DX: Benign prostatic hyperplasia without lower urinary tract symptoms: N40.0

## 2022-10-09 HISTORY — DX: Unspecified glaucoma: H40.9

## 2022-10-09 HISTORY — DX: Unspecified chronic bronchitis: J42

## 2022-10-09 HISTORY — DX: Type 2 diabetes mellitus without complications: E11.9

## 2022-10-09 HISTORY — DX: Essential (primary) hypertension: I10

## 2022-10-09 HISTORY — DX: Unspecified osteoarthritis, unspecified site: M19.90

## 2022-10-09 HISTORY — DX: Gout, unspecified: M10.9

## 2022-10-09 HISTORY — DX: Hyperlipidemia, unspecified: E78.5

## 2022-10-09 HISTORY — DX: Gastro-esophageal reflux disease without esophagitis: K21.9

## 2022-10-09 HISTORY — DX: Atherosclerotic heart disease of native coronary artery without angina pectoris: I25.10

## 2022-10-09 HISTORY — DX: Chronic kidney disease, stage 3 unspecified: N18.30

## 2022-10-09 LAB — CBC WITH DIFFERENTIAL/PLATELET
Abs Immature Granulocytes: 0.09 10*3/uL — ABNORMAL HIGH (ref 0.00–0.07)
Basophils Absolute: 0 10*3/uL (ref 0.0–0.1)
Basophils Relative: 0 %
Eosinophils Absolute: 0 10*3/uL (ref 0.0–0.5)
Eosinophils Relative: 0 %
HCT: 37.3 % — ABNORMAL LOW (ref 39.0–52.0)
Hemoglobin: 12.3 g/dL — ABNORMAL LOW (ref 13.0–17.0)
Immature Granulocytes: 1 %
Lymphocytes Relative: 4 %
Lymphs Abs: 0.7 10*3/uL (ref 0.7–4.0)
MCH: 30.3 pg (ref 26.0–34.0)
MCHC: 33 g/dL (ref 30.0–36.0)
MCV: 91.9 fL (ref 80.0–100.0)
Monocytes Absolute: 0.7 10*3/uL (ref 0.1–1.0)
Monocytes Relative: 4 %
Neutro Abs: 14.1 10*3/uL — ABNORMAL HIGH (ref 1.7–7.7)
Neutrophils Relative %: 91 %
Platelets: 241 10*3/uL (ref 150–400)
RBC: 4.06 MIL/uL — ABNORMAL LOW (ref 4.22–5.81)
RDW: 14.1 % (ref 11.5–15.5)
WBC: 15.6 10*3/uL — ABNORMAL HIGH (ref 4.0–10.5)
nRBC: 0 % (ref 0.0–0.2)

## 2022-10-09 LAB — COMPREHENSIVE METABOLIC PANEL
ALT: 12 U/L (ref 0–44)
AST: 24 U/L (ref 15–41)
Albumin: 4.6 g/dL (ref 3.5–5.0)
Alkaline Phosphatase: 59 U/L (ref 38–126)
Anion gap: 13 (ref 5–15)
BUN: 63 mg/dL — ABNORMAL HIGH (ref 8–23)
CO2: 19 mmol/L — ABNORMAL LOW (ref 22–32)
Calcium: 10.1 mg/dL (ref 8.9–10.3)
Chloride: 102 mmol/L (ref 98–111)
Creatinine, Ser: 3.45 mg/dL — ABNORMAL HIGH (ref 0.61–1.24)
GFR, Estimated: 18 mL/min — ABNORMAL LOW (ref >60)
Glucose, Bld: 289 mg/dL — ABNORMAL HIGH (ref 70–99)
Potassium: 5.1 mmol/L (ref 3.5–5.1)
Sodium: 134 mmol/L — ABNORMAL LOW (ref 135–145)
Total Bilirubin: 0.6 mg/dL (ref 0.3–1.2)
Total Protein: 9 g/dL — ABNORMAL HIGH (ref 6.5–8.1)

## 2022-10-09 LAB — LIPASE, BLOOD: Lipase: 50 U/L (ref 11–51)

## 2022-10-09 LAB — CBC
HCT: 35.9 % — ABNORMAL LOW (ref 39.0–52.0)
Hemoglobin: 11.9 g/dL — ABNORMAL LOW (ref 13.0–17.0)
MCH: 30.7 pg (ref 26.0–34.0)
MCHC: 33.1 g/dL (ref 30.0–36.0)
MCV: 92.5 fL (ref 80.0–100.0)
Platelets: 230 10*3/uL (ref 150–400)
RBC: 3.88 MIL/uL — ABNORMAL LOW (ref 4.22–5.81)
RDW: 14.3 % (ref 11.5–15.5)
WBC: 14.4 10*3/uL — ABNORMAL HIGH (ref 4.0–10.5)
nRBC: 0 % (ref 0.0–0.2)

## 2022-10-09 LAB — GLUCOSE, CAPILLARY
Glucose-Capillary: 164 mg/dL — ABNORMAL HIGH (ref 70–99)
Glucose-Capillary: 169 mg/dL — ABNORMAL HIGH (ref 70–99)
Glucose-Capillary: 170 mg/dL — ABNORMAL HIGH (ref 70–99)

## 2022-10-09 LAB — CREATININE, SERUM
Creatinine, Ser: 3.36 mg/dL — ABNORMAL HIGH (ref 0.61–1.24)
GFR, Estimated: 18 mL/min — ABNORMAL LOW (ref >60)

## 2022-10-09 LAB — RESP PANEL BY RT-PCR (RSV, FLU A&B, COVID)  RVPGX2
Influenza A by PCR: NEGATIVE
Influenza B by PCR: NEGATIVE
Resp Syncytial Virus by PCR: NEGATIVE
SARS Coronavirus 2 by RT PCR: NEGATIVE

## 2022-10-09 LAB — HEMOGLOBIN A1C
Hgb A1c MFr Bld: 7.1 % — ABNORMAL HIGH (ref 4.8–5.6)
Mean Plasma Glucose: 157.07 mg/dL

## 2022-10-09 LAB — BRAIN NATRIURETIC PEPTIDE: B Natriuretic Peptide: 51 pg/mL (ref 0.0–100.0)

## 2022-10-09 MED ORDER — HEPARIN SODIUM (PORCINE) 5000 UNIT/ML IJ SOLN
5000.0000 [IU] | Freq: Three times a day (TID) | INTRAMUSCULAR | Status: DC
  Administered 2022-10-09 – 2022-10-13 (×11): 5000 [IU] via SUBCUTANEOUS
  Filled 2022-10-09 (×12): qty 1

## 2022-10-09 MED ORDER — INSULIN ASPART 100 UNIT/ML IJ SOLN
0.0000 [IU] | INTRAMUSCULAR | Status: DC
  Administered 2022-10-14: 5 [IU] via SUBCUTANEOUS
  Administered 2022-10-15 (×2): 3 [IU] via SUBCUTANEOUS
  Administered 2022-10-15: 2 [IU] via SUBCUTANEOUS
  Administered 2022-10-15 (×3): 3 [IU] via SUBCUTANEOUS
  Administered 2022-10-16: 5 [IU] via SUBCUTANEOUS
  Administered 2022-10-16 (×4): 3 [IU] via SUBCUTANEOUS
  Administered 2022-10-16: 2 [IU] via SUBCUTANEOUS
  Administered 2022-10-16: 5 [IU] via SUBCUTANEOUS
  Administered 2022-10-17 (×5): 3 [IU] via SUBCUTANEOUS
  Administered 2022-10-18: 2 [IU] via SUBCUTANEOUS
  Administered 2022-10-18: 3 [IU] via SUBCUTANEOUS
  Administered 2022-10-18: 2 [IU] via SUBCUTANEOUS

## 2022-10-09 MED ORDER — LACTATED RINGERS IV SOLN: INTRAVENOUS | Status: DC

## 2022-10-09 MED ORDER — METOPROLOL TARTRATE 5 MG/5ML IV SOLN: 5.0000 mg | Freq: Four times a day (QID) | INTRAVENOUS | Status: DC | PRN

## 2022-10-09 MED ORDER — ONDANSETRON HCL 4 MG/2ML IJ SOLN
4.0000 mg | Freq: Four times a day (QID) | INTRAMUSCULAR | Status: DC | PRN
  Administered 2022-10-13 – 2022-10-16 (×2): 4 mg via INTRAVENOUS
  Filled 2022-10-09 (×3): qty 2

## 2022-10-09 MED ORDER — ONDANSETRON HCL 4 MG/2ML IJ SOLN
4.0000 mg | Freq: Once | INTRAMUSCULAR | Status: AC
  Administered 2022-10-09: 4 mg via INTRAVENOUS
  Filled 2022-10-09: qty 2

## 2022-10-09 MED ORDER — ENOXAPARIN SODIUM 30 MG/0.3ML IJ SOSY: 30.0000 mg | PREFILLED_SYRINGE | INTRAMUSCULAR | Status: DC

## 2022-10-09 MED ORDER — MORPHINE SULFATE (PF) 4 MG/ML IV SOLN
8.0000 mg | Freq: Once | INTRAVENOUS | Status: AC
  Administered 2022-10-09: 8 mg via INTRAVENOUS
  Filled 2022-10-09: qty 2

## 2022-10-09 MED ORDER — HYDROMORPHONE HCL 1 MG/ML IJ SOLN
0.5000 mg | INTRAMUSCULAR | Status: DC | PRN
  Administered 2022-10-09: 1 mg via INTRAVENOUS
  Filled 2022-10-09 (×2): qty 1

## 2022-10-09 MED ORDER — DIATRIZOATE MEGLUMINE & SODIUM 66-10 % PO SOLN
90.0000 mL | Freq: Once | ORAL | Status: AC
  Administered 2022-10-09: 90 mL via NASOGASTRIC
  Filled 2022-10-09: qty 90

## 2022-10-09 MED ORDER — ONDANSETRON HCL 4 MG PO TABS: 4.0000 mg | ORAL_TABLET | Freq: Four times a day (QID) | ORAL | Status: DC | PRN

## 2022-10-09 MED ORDER — LACTATED RINGERS IV BOLUS
1000.0000 mL | Freq: Once | INTRAVENOUS | Status: AC
  Administered 2022-10-09: 1000 mL via INTRAVENOUS

## 2022-10-09 MED ORDER — PHENOL 1.4 % MT LIQD
1.0000 | OROMUCOSAL | Status: DC | PRN
  Administered 2022-10-09: 1 via OROMUCOSAL
  Filled 2022-10-09: qty 177

## 2022-10-09 MED ORDER — SODIUM CHLORIDE 0.9% FLUSH
3.0000 mL | Freq: Two times a day (BID) | INTRAVENOUS | Status: DC
  Administered 2022-10-09 – 2022-10-18 (×8): 3 mL via INTRAVENOUS

## 2022-10-09 NOTE — Consult Note (Signed)
Reason for Consult:SBO Referring Physician: Breton Romine is an 78 y.o. male.  HPI:  Patient is a 77 year old male who started having sudden onset of abdominal pain yesterday afternoon.  He describes a sensation of severe cramping pain at a point around halfway between his bellybutton and his breastbone.  He describes these severe pains lasting for around 20 seconds every 3-5 min.  This lasted all night.  When it did not improve he sought help today.  Today he also has had vomiting and diarrhea.  He denies fevers or chills.  He is unsure if he had flatus today as he states that he thought he had to pass gas and had some diarrhea but is not sure if there was gas accompanying the diarrhea.  He has not had pain like this before.  History reviewed. No pertinent past medical history.  Past Surgical History:  Procedure Laterality Date   EXPLORATION POST OPERATIVE OPEN HEART     PACEMAKER IMPLANT      History reviewed. No pertinent family history.  Social History:  reports that he has never smoked. He has never used smokeless tobacco. He reports that he does not drink alcohol and does not use drugs.  Allergies:  Allergies  Allergen Reactions   Penicillins     Medications:  None   Results for orders placed or performed during the hospital encounter of 10/09/22 (from the past 48 hour(s))  CBC with Differential     Status: Abnormal   Collection Time: 10/09/22  1:03 PM  Result Value Ref Range   WBC 15.6 (H) 4.0 - 10.5 K/uL   RBC 4.06 (L) 4.22 - 5.81 MIL/uL   Hemoglobin 12.3 (L) 13.0 - 17.0 g/dL   HCT 37.3 (L) 39.0 - 52.0 %   MCV 91.9 80.0 - 100.0 fL   MCH 30.3 26.0 - 34.0 pg   MCHC 33.0 30.0 - 36.0 g/dL   RDW 14.1 11.5 - 15.5 %   Platelets 241 150 - 400 K/uL   nRBC 0.0 0.0 - 0.2 %   Neutrophils Relative % 91 %   Neutro Abs 14.1 (H) 1.7 - 7.7 K/uL   Lymphocytes Relative 4 %   Lymphs Abs 0.7 0.7 - 4.0 K/uL   Monocytes Relative 4 %   Monocytes Absolute 0.7 0.1 - 1.0  K/uL   Eosinophils Relative 0 %   Eosinophils Absolute 0.0 0.0 - 0.5 K/uL   Basophils Relative 0 %   Basophils Absolute 0.0 0.0 - 0.1 K/uL   Immature Granulocytes 1 %   Abs Immature Granulocytes 0.09 (H) 0.00 - 0.07 K/uL    Comment: Performed at [facility], [place] [address] [place], [place] [address]  Comprehensive metabolic panel     Status: Abnormal   Collection Time: 10/09/22  1:03 PM  Result Value Ref Range   Sodium 134 (L) 135 - 145 mmol/L   Potassium 5.1 3.5 - 5.1 mmol/L   Chloride 102 98 - 111 mmol/L   CO2 19 (L) 22 - 32 mmol/L   Glucose, Bld 289 (H) 70 - 99 mg/dL    Comment: Glucose reference range applies only to samples taken after fasting for at least 8 hours.   BUN 63 (H) 8 - 23 mg/dL   Creatinine, Ser 3.45 (H) 0.61 - 1.24 mg/dL   Calcium 10.1 8.9 - 10.3 mg/dL   Total Protein 9.0 (H) 6.5 - 8.1 g/dL   Albumin 4.6 3.5 - 5.0 g/dL   AST 24  15 - 41 U/L   ALT 12 0 - 44 U/L   Alkaline Phosphatase 59 38 - 126 U/L   Total Bilirubin 0.6 0.3 - 1.2 mg/dL   GFR, Estimated 18 (L) >60 mL/min    Comment: (NOTE) Calculated using the CKD-EPI Creatinine Equation (2021)    Anion gap 13 5 - 15    Comment: Performed at [facility], [place] [address], [place], [place] [address]  Lipase, blood     Status: None   Collection Time: 10/09/22  1:03 PM  Result Value Ref Range   Lipase 50 11 - 51 U/L    Comment: Performed at [facility], [place] [address], [place], [place] [address]  Brain natriuretic peptide     Status: None   Collection Time: 10/09/22  1:03 PM  Result Value Ref Range   B Natriuretic Peptide 51.0 0.0 - 100.0 pg/mL    Comment: Performed at [facility], [place] [address], [place], [place] [address]  Resp panel by RT-PCR (RSV, Flu A&B, Covid) Anterior Nasal Swab     Status: None   Collection Time: 10/09/22  1:14 PM   Specimen: Anterior Nasal Swab  Result Value Ref Range   SARS Coronavirus 2 by RT PCR  NEGATIVE NEGATIVE    Comment: (NOTE) SARS-CoV-2 target nucleic acids are NOT DETECTED.  The SARS-CoV-2 RNA is generally detectable in upper respiratory specimens during the acute phase of infection. The lowest concentration of SARS-CoV-2 viral copies this assay can detect is 138 copies/mL. A negative result does not preclude SARS-Cov-2 infection and should not be used as the sole basis for treatment or other patient management decisions. A negative result may occur with  improper specimen collection/handling, submission of specimen other than nasopharyngeal swab, presence of viral mutation(s) within the areas targeted by this assay, and inadequate number of viral copies(<138 copies/mL). A negative result must be combined with clinical observations, patient history, and epidemiological information. The expected result is Negative.  Fact Sheet for Patients:  EntrepreneurPulse.com.au  Fact Sheet for Healthcare Providers:  IncredibleEmployment.be  This test is no t yet approved or cleared by the [place] FDA and  has been authorized for detection and/or diagnosis of SARS-CoV-2 by FDA under an Emergency Use Authorization (EUA). This EUA will remain  in effect (meaning this test can be used) for the duration of the COVID-19 declaration under Section 564(b)(1) of the Act, 21 U.S.C.section 360bbb-3(b)(1), unless the authorization is terminated  or revoked sooner.       Influenza A by PCR NEGATIVE NEGATIVE   Influenza B by PCR NEGATIVE NEGATIVE    Comment: (NOTE) The Xpert Xpress SARS-CoV-2/FLU/RSV plus assay is intended as an aid in the diagnosis of influenza from Nasopharyngeal swab specimens and should not be used as a sole basis for treatment. Nasal washings and aspirates are unacceptable for Xpert Xpress SARS-CoV-2/FLU/RSV testing.  Fact Sheet for Patients: EntrepreneurPulse.com.au  Fact Sheet for Healthcare  Providers: IncredibleEmployment.be  This test is not yet approved or cleared by the [place] FDA and has been authorized for detection and/or diagnosis of SARS-CoV-2 by FDA under an Emergency Use Authorization (EUA). This EUA will remain in effect (meaning this test can be used) for the duration of the COVID-19 declaration under Section 564(b)(1) of the Act, 21 U.S.C. section 360bbb-3(b)(1), unless the authorization is terminated or revoked.     Resp Syncytial Virus by PCR NEGATIVE NEGATIVE    Comment: (NOTE) Fact Sheet for Patients: EntrepreneurPulse.com.au  Fact Sheet for Healthcare Providers: IncredibleEmployment.be  This test is not yet approved or cleared by the [place] FDA and has been authorized for detection and/or diagnosis of SARS-CoV-2 by FDA under an Emergency Use Authorization (EUA). This EUA will remain in effect (meaning this test can be used) for the duration of the COVID-19 declaration under Section 564(b)(1) of the Act, 21 U.S.C. section 360bbb-3(b)(1), unless the authorization is terminated or revoked.  Performed at [facility], [place] [address], [place], [place] [address]     CT ABDOMEN PELVIS WO CONTRAST  Result Date: 10/09/2022 CLINICAL DATA:  Acute abdominal pain. Patient reports pain and distension since last night. Nausea, vomiting, diarrhea. EXAM: CT ABDOMEN AND PELVIS WITHOUT CONTRAST TECHNIQUE: Multidetector CT imaging of the abdomen and pelvis was performed following the standard protocol without IV contrast. RADIATION DOSE REDUCTION: This exam was performed according to the departmental dose-optimization program which includes automated exposure control, adjustment of the mA and/or kV according to patient size and/or use of iterative reconstruction technique. COMPARISON:  None Available. FINDINGS: Lower chest: Pacemaker wires partially visualized, mild cardiomegaly. Trace  right pleural effusion. Hepatobiliary: Subcentimeter subcapsular low-density in the posterior right hepatic lobe is too small to accurately characterize but likely small cyst. No evidence of solid liver lesion on this unenhanced exam. Gallbladder physiologically distended, no calcified stone. No biliary dilatation. Pancreas: No ductal dilatation or inflammation. Spleen: Normal in size without focal abnormality. Adrenals/Urinary Tract: No adrenal nodule. There is slight adrenal thickening on the left. Bilateral renal parenchymal thinning. No hydronephrosis or renal calculi. No evidence of solid renal lesion. Both ureters are decompressed. The urinary bladder is partially distended, grossly normal for degree of distension. Stomach/Bowel: Stomach is partially distended with fluid. Small bowel is dilated and fluid-filled. There is fecalization of small bowel contents within small bowel loop in the right abdomen. Transition from dilated to nondilated small bowel just distal to the fecalized segment where there is short segment of wall thickening. Transition on series 2, image 55 and series 5, image 34. There is trace mesenteric edema and free fluid but no small bowel pneumatosis, free air or perforation. Small bowel distal to the transition point is decompressed. Suspected diverticulosis of the appendix but no appendicitis or diverticulitis. Additional diverticular seen about the cecum. Majority of the colon is nondistended. No colonic inflammatory change. Vascular/Lymphatic: Aortic atherosclerosis without aneurysm. No portal venous or mesenteric gas. No bulky abdominopelvic adenopathy. Reproductive: Mild prostate enlargement with mass effect on the bladder base. Other: Mesenteric edema with small amount of mesenteric free fluid in the small bowel mesentery. No perforation, free air or focal fluid collection. There is fat within both inguinal canals. Musculoskeletal: There are 4 non-rib-bearing lumbar vertebra. Moderate  lower lumbar facet hypertrophy. Bilateral hip osteoarthritis. There are no acute or suspicious osseous abnormalities. IMPRESSION: 1. Small bowel obstruction with transition point in the right abdomen. There is short segment of wall thickening of small bowel at the transition point. This may be due to adhesions if there is history of prior abdominal surgery inflammation, however the possibility of small bowel stricture, neoplasm or focal enteritis is also considered. 2. Minimal ascending colonic diverticulosis. There is also a diverticular changes of the appendix. No acute diverticulitis or appendicitis. 3. Mild prostate enlargement with mass effect on the bladder base. 4. Trace right pleural effusion. Aortic Atherosclerosis (ICD10-I70.0). Electronically Signed   By: Keith Rake M.D.   On: 10/09/2022 15:43    Review of Systems  Constitutional: Negative.  HENT: Negative.    Eyes: Negative.   Respiratory: Negative.    Gastrointestinal:  Positive for abdominal distention, abdominal pain, diarrhea, nausea and vomiting.  Endocrine: Negative.   Genitourinary: Negative.   Musculoskeletal: Negative.   Allergic/Immunologic: Negative.   Neurological: Negative.   Hematological: Negative.   Psychiatric/Behavioral: Negative.    All other systems reviewed and are negative.  Blood pressure 128/67, pulse 63, temperature 97.8 F (36.6 C), temperature source Oral, resp. rate 16, height '6\' 4"'$  (1.93 m), weight 92.5 kg, SpO2 99 %. Physical Exam Vitals reviewed.  Constitutional:      General: He is not in acute distress.    Appearance: He is well-developed. He is not ill-appearing, toxic-appearing or diaphoretic.  HENT:     Head: Normocephalic and atraumatic.  Eyes:     General: No scleral icterus.    Extraocular Movements: Extraocular movements intact.  Cardiovascular:     Rate and Rhythm: Normal rate and regular rhythm.  Pulmonary:     Effort: Pulmonary effort is normal.  Chest:     Chest wall: No  tenderness.  Abdominal:     General: A surgical scar is present. There is distension. There are no signs of injury.     Palpations: Abdomen is soft. There is no shifting dullness, fluid wave, hepatomegaly, splenomegaly, mass or pulsatile mass.     Tenderness: There is abdominal tenderness in the epigastric area.     Hernia: No hernia is present.  Skin:    General: Skin is warm and dry.     Capillary Refill: Capillary refill takes 2 to 3 seconds.     Coloration: Skin is not cyanotic, jaundiced, mottled or pale.     Findings: No erythema or rash.  Neurological:     General: No focal deficit present.     Mental Status: He is alert and oriented to person, place, and time.  Psychiatric:        Mood and Affect: Mood normal. Mood is not anxious or depressed.        Behavior: Behavior normal.     Assessment/Plan: SBO vs gastroenteritis with obstructive component.   Patient with symptoms of nausea/vomiting and diarrhea in the setting of no prior abdominal surgery.  There is a thickened segment of small bowel that may be causing obstructive type symptoms.  This points towards gastroenteritis rather than adhesive SBO.  I cannot palpate any umbilical, ventral, or inguinal hernias.  The thickened area is less consistent with a mass.    I do recommend NGT to LIWS and bowel rest with IV fluid support.    Will do small bowel protocol.  Surgery team to follow.    Stark Klein 10/09/2022, 4:20 PM

## 2022-10-09 NOTE — ED Provider Triage Note (Signed)
Emergency Medicine Provider Triage Evaluation Note  Jacob Guerrero , a 77 y.o. male  was evaluated in triage.  Pt complains of epigastric pain that began last night. Pain does not radiate. Patient had one episode of non bloody emesis. Patient has not eaten today due to pain. 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 Patient endorsed new abd swelling. Patient stated when he stands he gets short of breath.  Patient denied chest pain, HA, vision change, leg swelling, new meds/food, sick contacts, fever  Review of Systems  Positive: See HPI Negative: See HPI  Physical Exam  BP 102/60 (BP Location: Left Arm)   Pulse 72   Temp 97.6 F (36.4 C) (Oral)   Resp 18   Ht '6\' 4"'$  (1.93 m)   Wt 92.5 kg   SpO2 100%   BMI 24.83 kg/m  Gen:   Awake, no distress   Resp:  Normal effort  MSK:   Moves extremities without difficulty  Other:  Abd tender to palpation in all 4 quads, no peritoneal signs noted  Medical Decision Making  Medically screening exam initiated at 12:37 PM.  Appropriate orders placed.  Jacob Guerrero was informed that the remainder of the evaluation will be completed by another provider, this initial triage assessment does not replace that evaluation, and the importance of remaining in the ED until their evaluation is complete.  Workup initiated, patient not in distress   Jacob Guerrero 10/09/22 1242

## 2022-10-09 NOTE — ED Provider Notes (Signed)
[facility] Provider Note   CSN: [id number] Arrival date & time: 10/09/22  1202     History  Chief Complaint  Patient presents with   Abdominal Pain    Jacob Guerrero is a 77 y.o. male.  76 year old male presents with abdominal pain.  Patient's pain has been epigastric in nature and last for minutes to seconds.  He has had associated emesis.  No prior history of same.  No fever or chills.  Denies any diarrhea.  No treatment use prior to arrival       Home Medications Prior to Admission medications   Medication Sig Start Date End Date Taking? Authorizing Provider  benzonatate (TESSALON) 100 MG capsule Take 1 capsule (100 mg total) by mouth every 8 (eight) hours. 08/17/21   Sherrill Raring, PA-C  predniSONE (DELTASONE) 10 MG tablet Take 4 tablets (40 mg total) by mouth daily. 08/17/21   Sherrill Raring, PA-C      Allergies    Penicillins    Review of Systems   Review of Systems  All other systems reviewed and are negative.   Physical Exam Updated Vital Signs BP 128/67 (BP Location: Right Arm)   Pulse 63   Temp 97.8 F (36.6 C) (Oral)   Resp 16   Ht 1.93 m ('6\' 4"'$ )   Wt 92.5 kg   SpO2 99%   BMI 24.83 kg/m  Physical Exam Vitals and nursing note reviewed.  Constitutional:      General: He is not in acute distress.    Appearance: Normal appearance. He is well-developed. He is not toxic-appearing.  HENT:     Head: Normocephalic and atraumatic.  Eyes:     General: Lids are normal.     Conjunctiva/sclera: Conjunctivae normal.     Pupils: Pupils are equal, round, and reactive to light.  Neck:     Thyroid: No thyroid mass.     Trachea: No tracheal deviation.  Cardiovascular:     Rate and Rhythm: Normal rate and regular rhythm.     Heart sounds: Normal heart sounds. No murmur heard.    No gallop.  Pulmonary:     Effort: Pulmonary effort is normal. No respiratory distress.     Breath sounds: Normal breath sounds. No  stridor. No decreased breath sounds, wheezing, rhonchi or rales.  Abdominal:     General: There is no distension.     Palpations: Abdomen is soft.     Tenderness: There is abdominal tenderness in the epigastric area. There is guarding. There is no rebound.    Musculoskeletal:        General: No tenderness. Normal range of motion.     Cervical back: Normal range of motion and neck supple.  Skin:    General: Skin is warm and dry.     Findings: No abrasion or rash.  Neurological:     Mental Status: He is alert and oriented to person, place, and time. Mental status is at baseline.     GCS: GCS eye subscore is 4. GCS verbal subscore is 5. GCS motor subscore is 6.     Cranial Nerves: No cranial nerve deficit.     Sensory: No sensory deficit.     Motor: Motor function is intact.  Psychiatric:        Attention and Perception: Attention normal.        Speech: Speech normal.        Behavior: Behavior normal.  ED Results / Procedures / Treatments   Labs (all labs ordered are listed, but only abnormal results are displayed) Labs Reviewed  CBC WITH DIFFERENTIAL/PLATELET - Abnormal; Notable for the following components:      Result Value   WBC 15.6 (*)    RBC 4.06 (*)    Hemoglobin 12.3 (*)    HCT 37.3 (*)    Neutro Abs 14.1 (*)    Abs Immature Granulocytes 0.09 (*)    All other components within normal limits  COMPREHENSIVE METABOLIC PANEL - Abnormal; Notable for the following components:   Sodium 134 (*)    CO2 19 (*)    Glucose, Bld 289 (*)    BUN 63 (*)    Creatinine, Ser 3.45 (*)    Total Protein 9.0 (*)    GFR, Estimated 18 (*)    All other components within normal limits  RESP PANEL BY RT-PCR (RSV, FLU A&B, COVID)  [id number]  LIPASE, BLOOD  URINALYSIS, ROUTINE W REFLEX MICROSCOPIC  BRAIN NATRIURETIC PEPTIDE    EKG EKG Interpretation  Date/Time:  Friday October 09 2022 12:53:18 EST Ventricular Rate:  69 PR Interval:  158 QRS Duration: 158 QT Interval:  443 QTC  Calculation: 475 R Axis:   -59 Text Interpretation: Sinus rhythm RBBB and LAFB No significant change since last tracing Confirmed by Dorie Rank [phone]) on 10/09/2022 1:24:30 PM  Radiology No results found.  Procedures Procedures    Medications Ordered in ED Medications  morphine (PF) 4 MG/ML injection 8 mg (has no administration in time range)  ondansetron (ZOFRAN) injection 4 mg (has no administration in time range)    ED Course/ Medical Decision Making/ A&P                             Medical Decision Making Amount and/or Complexity of Data Reviewed Radiology: ordered.  Risk Prescription drug management.   Patient medicated with IV fluids and pain medications.  Concern for possible intra-abdominal pathology.  White count is elevated.  Abdominal CT performed shows evidence of small bowel obstruction.  Patient has evidence of acute kidney injury.  Will give IV hydration.  NG tube to be placed.  Discussed with general surgery who requests patient have medical admission.  Will consult hospitalist team.  CRITICAL CARE Performed by: Leota Jacobsen Total critical care time: 45 minutes Critical care time was exclusive of separately billable procedures and treating other patients. Critical care was necessary to treat or prevent imminent or life-threatening deterioration. Critical care was time spent personally by me on the following activities: development of treatment plan with patient and/or surrogate as well as nursing, discussions with consultants, evaluation of patient's response to treatment, examination of patient, obtaining history from patient or surrogate, ordering and performing treatments and interventions, ordering and review of laboratory studies, ordering and review of radiographic studies, pulse oximetry and re-evaluation of patient's condition.        Final Clinical Impression(s) / ED Diagnoses Final diagnoses:  None    Rx / DC Orders ED Discharge Orders      None         Lacretia Leigh, MD 10/09/22 [phone]

## 2022-10-09 NOTE — ED Triage Notes (Signed)
Pt presents to ED from home C/O centralized abdominal pain and distention since last night. Endorses n/v/d. Unknown when last normal BM was, but reports 5-6 episodes of watery loose stools this AM.

## 2022-10-09 NOTE — ED Provider Triage Note (Signed)
Emergency Medicine Provider Triage Evaluation Note  Ares Castel , a 77 y.o. male  was evaluated in triage.  Pt complains of abdominal pain.  Pain is epigastric and has been present since this morning.  Patient states pain does not radiate but he has been unable to keep food or fluids down.  Patient endorsed nonbloody emesis.  Patient denies chest pain, shortness of breath, syncope  Review of Systems  Positive: See HPI Negative: See HPI  Physical Exam  BP 102/60 (BP Location: Left Arm)   Pulse 72   Temp 97.6 F (36.4 C) (Oral)   Resp 18   Ht '6\' 4"'$  (1.93 m)   Wt 92.5 kg   SpO2 100%   BMI 24.83 kg/m  Gen:   Awake, no distress   Resp:  Normal effort  MSK:   Moves extremities without difficulty  Other:  Patient tender throughout abdomen without peritoneal signs  Medical Decision Making  Medically screening exam initiated at 12:29 PM.  Appropriate orders placed.  [name] was informed that the remainder of the evaluation will be completed by another provider, this initial triage assessment does not replace that evaluation, and the importance of remaining in the ED until their evaluation is complete.  Workup initiated, patient not in any distress at this time.   Chuck Hint, PA-C 10/10/22 1110

## 2022-10-09 NOTE — ED Notes (Signed)
Pt unable to produce a urine specimen at this time

## 2022-10-09 NOTE — ED Notes (Signed)
Patient unable to urinate; Advised we can cath, patient declined.

## 2022-10-09 NOTE — ED Notes (Signed)
NG tube inserted to right Nare, emesis noted during insertion. Immediate return of gastric content. Secured with clip.

## 2022-10-09 NOTE — H&P (Signed)
History and Physical    Jacob Guerrero [id number] DOB: September 30, 1945 DOA: 10/09/2022  [facility] [place] [facility] Patient coming from: Brother's home  Chief Complaint: Abdominal pain.   HPI: Jacob Guerrero is a 77 y.o. male with medical history significant of CKD, DM2, HTN, Gout, Chronic bronchitis, GERD, glaucoma, RBBB, CAD with CABG and pacemaker, BPH, anemia, OA, HLD who presents for abdominal pain and found to have an SBO.  He was travelling yesterday with his girlfriend from [place] to visit his brother who lives around this area.  Last night, after travelling, he developed abdominal pain which was sharp, above the belly button and relatively constant.  He had emesis (non bloody) and bowel movements which would briefly improve the pain, but otherwise it was constant.  He noticed no blood loss.  Nothing else made the pain better.  He notes no preceding illness, no sick contacts.  He ate mostly fruit on the drive down here and did not feel that it was very out of the norm for him.  Further symptoms include dizziness when standing.  This will happen often, but not everytime he stands up.  He will feel dizzy and hot and feel like the room is darkening.  It will pass if he gives it time.    ED Course: In the ED, he was found to have a WBC of 14.4, H/H of 11 and 35, Na of 134, bicarb of 19, glucose of 289, BUN of 63, Cr of 3.45 (BUN/Cr ratio is 18).  CT abdomen showed an SBO with possible SB inflammation.  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  He had an NGT placed and surgery was consulted.  Surgery will do a gastrograffin follow through.    Review of Systems: As per HPI otherwise all other systems reviewed and are negative.  Past Medical History:  Diagnosis Date   BPH (benign prostatic hyperplasia)    CAD (coronary artery disease)    Chronic bronchitis (HCC)    CKD (chronic kidney disease) stage 3, GFR 30-59 ml/min (HCC)    Diabetes mellitus ([place])     Essential hypertension    GERD (gastroesophageal reflux disease)    Glaucoma    Gout    HLD (hyperlipidemia)    Osteoarthritis     Past Surgical History:  Procedure Laterality Date   CORONARY ARTERY BYPASS GRAFT     EXPLORATION POST OPERATIVE OPEN HEART     PACEMAKER IMPLANT      Social History  reports that he has never smoked. He has never used smokeless tobacco. He reports that he does not drink alcohol and does not use drugs.  Allergies  Allergen Reactions   Brimonidine Other (See Comments)     follicular conj   Penicillins Other (See Comments)    Unknown     History reviewed. No pertinent family history. No current facility-administered medications on file prior to encounter.   Current Outpatient Medications on File Prior to Encounter  Medication Sig Dispense Refill   acetaminophen (TYLENOL) 500 MG tablet Take 1,000 mg by mouth 2 (two) times daily as needed for moderate pain.     albuterol (PROVENTIL) (2.5 MG/3ML) 0.083% nebulizer solution Take 2.5 mg by nebulization as needed for wheezing or shortness of breath.     albuterol (VENTOLIN HFA) 108 (90 Base) MCG/ACT inhaler Inhale 2 puffs into the lungs as needed for wheezing or shortness of breath.     allopurinol (ZYLOPRIM) 300 MG tablet  Take 300 mg by mouth daily.     ASPIRIN LOW DOSE 81 MG tablet Take 81 mg by mouth daily.     carvedilol (COREG) 25 MG tablet Take 25 mg by mouth 2 (two) times daily.     Cholecalciferol (VITAMIN D3 SUPER STRENGTH) 50 MCG (2000 UT) TABS Take 2,000 Units by mouth daily.     CRESTOR 40 MG tablet Take 40 mg by mouth daily.     diclofenac Sodium (VOLTAREN) 1 % GEL Apply 1 Application topically as needed (pain).     dorzolamide (TRUSOPT) 2 % ophthalmic solution Place 1 drop into both eyes 3 (three) times daily.     ENTRESTO 24-26 MG Take 1 tablet by mouth 2 (two) times daily.     fexofenadine (ALLEGRA) 180 MG tablet Take 180 mg by mouth as needed for allergies.     finasteride (PROSCAR) 5 MG  tablet Take 5 mg by mouth daily.     FLONASE ALLERGY RELIEF 50 MCG/ACT nasal spray Place 2 sprays into both nostrils as needed for allergies or rhinitis.     fluticasone (FLOVENT HFA) 110 MCG/ACT inhaler Inhale 1-2 puffs into the lungs as needed (wheezing/SOB).     fluticasone-salmeterol (ADVAIR HFA) 230-21 MCG/ACT inhaler Inhale 2 puffs into the lungs 2 (two) times daily.     furosemide (LASIX) 40 MG tablet Take 40 mg by mouth daily.     glipiZIDE (GLUCOTROL XL) 10 MG 24 hr tablet Take 10 mg by mouth daily.     JANUMET 50-500 MG tablet Take 1 tablet by mouth 2 (two) times daily with a meal.     Netarsudil-Latanoprost 0.02-0.005 % SOLN Place 1 drop into both eyes at bedtime.     nitroGLYCERIN (NITROSTAT) 0.4 MG SL tablet Place 0.4 mg under the tongue every 5 (five) minutes as needed for chest pain.     omeprazole (PRILOSEC) 40 MG capsule Take 40 mg by mouth daily.     sildenafil (VIAGRA) 50 MG tablet Take 50 mg by mouth as needed for erectile dysfunction.     predniSONE (DELTASONE) 10 MG tablet Take 4 tablets (40 mg total) by mouth daily. (Patient not taking: Reported on 10/09/2022) 20 tablet 0    Physical Exam: Vitals:   10/09/22 1400 10/09/22 1445 10/09/22 1650 10/09/22 1742  BP:  (!) 130/57 (!) 135/55 (!) 138/57  Pulse:  66 68 72  Resp: '20 20 20 18  '$ Temp:  98 F (36.7 C)  98.2 F (36.8 C)  TempSrc:    Oral  SpO2:  95% 99% 99%  Weight:      Height:        Constitutional: NAD, calm, comfortable, lying in bed Eyes: lids and conjunctivae normal, no icterus ENMT: Mucous membranes are very dry Neck: normal, supple Respiratory: CTAB, no wheezing, no rales Cardiovascular: RR, NR, no murmur noted, he has a well healed midline chest scar Abdomen: + distention, + tenderness in the epigastrium, paucity of bowel sounds.  Musculoskeletal: normal tone and bulk for age Skin: no rashes, lesions, ulcers on exposed skin.   Psychiatric: Normal judgment and insight. Alert and oriented x 3. Normal  mood.    Labs on Admission: I have personally reviewed following labs and imaging studies  CBC: Recent Labs  Lab 10/09/22 1303 10/09/22 1814  WBC 15.6* 14.4*  NEUTROABS 14.1*  --   HGB 12.3* 11.9*  HCT 37.3* 35.9*  MCV 91.9 92.5  PLT 241 [id number]    Basic Metabolic Panel: Recent Labs  Lab 10/09/22 1303  NA 134*  K 5.1  CL 102  CO2 19*  GLUCOSE 289*  BUN 63*  CREATININE 3.45*  CALCIUM 10.1    GFR: Estimated Creatinine Clearance: 22 mL/min (A) (by C-G formula based on SCr of 3.45 mg/dL (H)).  Liver Function Tests: Recent Labs  Lab 10/09/22 1303  AST 24  ALT 12  ALKPHOS 59  BILITOT 0.6  PROT 9.0*  ALBUMIN 4.6    Urine analysis: No results found for: "COLORURINE", "APPEARANCEUR", "LABSPEC", "PHURINE", "GLUCOSEU", "HGBUR", "BILIRUBINUR", "KETONESUR", "PROTEINUR", "UROBILINOGEN", "NITRITE", "LEUKOCYTESUR"  Radiological Exams on Admission: DG Abd Portable 1V-Small Bowel Protocol-Position Verification  Result Date: 10/09/2022 CLINICAL DATA:  Tube placement EXAM: PORTABLE ABDOMEN - 1 VIEW limited for tube placement COMPARISON:  None Available. FINDINGS: Limited x-ray of the upper abdomen and lower chest were NG tube placement demonstrates tube overlying the fundus of the stomach. Side hole beneath the diaphragm. Elsewhere there are some dilated loops of small bowel in the left upper quadrant of the abdomen. Defibrillator. IMPRESSION: Limited x-ray for tube placement has tip overlying the fundus of the stomach Electronically Signed   By: Jill Side M.D.   On: 10/09/2022 17:34   CT ABDOMEN PELVIS WO CONTRAST  Result Date: 10/09/2022 CLINICAL DATA:  Acute abdominal pain. Patient reports pain and distension since last night. Nausea, vomiting, diarrhea. EXAM: CT ABDOMEN AND PELVIS WITHOUT CONTRAST TECHNIQUE: Multidetector CT imaging of the abdomen and pelvis was performed following the standard protocol without IV contrast. RADIATION DOSE REDUCTION: This exam was performed  according to the departmental dose-optimization program which includes automated exposure control, adjustment of the mA and/or kV according to patient size and/or use of iterative reconstruction technique. COMPARISON:  None Available. FINDINGS: Lower chest: Pacemaker wires partially visualized, mild cardiomegaly. Trace right pleural effusion. Hepatobiliary: Subcentimeter subcapsular low-density in the posterior right hepatic lobe is too small to accurately characterize but likely small cyst. No evidence of solid liver lesion on this unenhanced exam. Gallbladder physiologically distended, no calcified stone. No biliary dilatation. Pancreas: No ductal dilatation or inflammation. Spleen: Normal in size without focal abnormality. Adrenals/Urinary Tract: No adrenal nodule. There is slight adrenal thickening on the left. Bilateral renal parenchymal thinning. No hydronephrosis or renal calculi. No evidence of solid renal lesion. Both ureters are decompressed. The urinary bladder is partially distended, grossly normal for degree of distension. Stomach/Bowel: Stomach is partially distended with fluid. Small bowel is dilated and fluid-filled. There is fecalization of small bowel contents within small bowel loop in the right abdomen. Transition from dilated to nondilated small bowel just distal to the fecalized segment where there is short segment of wall thickening. Transition on series 2, image 55 and series 5, image 34. There is trace mesenteric edema and free fluid but no small bowel pneumatosis, free air or perforation. Small bowel distal to the transition point is decompressed. Suspected diverticulosis of the appendix but no appendicitis or diverticulitis. Additional diverticular seen about the cecum. Majority of the colon is nondistended. No colonic inflammatory change. Vascular/Lymphatic: Aortic atherosclerosis without aneurysm. No portal venous or mesenteric gas. No bulky abdominopelvic adenopathy. Reproductive: Mild  prostate enlargement with mass effect on the bladder base. Other: Mesenteric edema with small amount of mesenteric free fluid in the small bowel mesentery. No perforation, free air or focal fluid collection. There is fat within both inguinal canals. Musculoskeletal: There are 4 non-rib-bearing lumbar vertebra. Moderate lower lumbar facet hypertrophy. Bilateral hip osteoarthritis. There are no acute or suspicious osseous abnormalities. IMPRESSION: 1. Small  bowel obstruction with transition point in the right abdomen. There is short segment of wall thickening of small bowel at the transition point. This may be due to adhesions if there is history of prior abdominal surgery inflammation, however the possibility of small bowel stricture, neoplasm or focal enteritis is also considered. 2. Minimal ascending colonic diverticulosis. There is also a diverticular changes of the appendix. No acute diverticulitis or appendicitis. 3. Mild prostate enlargement with mass effect on the bladder base. 4. Trace right pleural effusion. Aortic Atherosclerosis (ICD10-I70.0). Electronically Signed   By: Keith Rake M.D.   On: 10/09/2022 15:43    EKG: Independently reviewed. NSR with RBBB and LAFB  Assessment/Plan  SBO (small bowel obstruction) (HCC) Leukocytosis - NGT in place - NPO - Gen Surg following, small bowel follow through - Monitor for fever, possible enteritis as cause? - Would get cultures and start Abx for any fever or other change in status - AM AXR  AKI on CKD (chronic kidney disease) stage 3, GFR 30-59 ml/min (HCC) Metabolic acidosis Mild hyponatremia - Received 1L of IVF in the ED - LR 125cc/hr - Trend renal function - Trend bicarb - Most likely pre-renal in the setting of nausea, emesis and SBO - Consider post-renal, bladder scans prn - Monitor I/O - Renal US if no urinary output would be reasonable.   BPH (benign prostatic hyperplasia) - Holding oral meds - He is on finasteride at home     CAD (coronary artery disease) - Holding oral meds - He is listed as being on coreg and entresto as home med - Hold home lasix '40mg'$  daily    Diabetes mellitus ([place]) - Hold oral medications - SSI    Essential hypertension - BP is well controlled - Holding oral meds, coreg, entresto, lasix - Can give IV lasix if needed with fluids - PRN IV metoprolol ordered    GERD (gastroesophageal reflux disease) - Hold home med of omeprazole    Gout - Hold home allopurinol - Monitor for flare - Steroids for flare    HLD (hyperlipidemia) - Hold statin  DVT prophylaxis: Heparin  Code Status:   Full, was unable to confirm, reassess  Disposition Plan:   Patient is from:  Home  Anticipated DC to:  Home  Anticipated DC date:  10/11/22  Anticipated DC barriers: Ability to clear SBO  Consults called:  Gen Surg, Dr. Barry Dienes  Admission status:  INP, med surg   Severity of Illness: The appropriate patient status for this patient is INPATIENT. Inpatient status is judged to be reasonable and necessary in order to provide the required intensity of service to ensure the patient's safety. The patient's presenting symptoms, physical exam findings, and initial radiographic and laboratory data in the context of their chronic comorbidities is felt to place them at high risk for further clinical deterioration. Furthermore, it is not anticipated that the patient will be medically stable for discharge from the hospital within 2 midnights of admission.   * I certify that at the point of admission it is my clinical judgment that the patient will require inpatient hospital care spanning beyond 2 midnights from the point of admission due to high intensity of service, high risk for further deterioration and high frequency of surveillance required.Gilles Chiquito MD [facility]  How to contact the [facility] Attending or Consulting provider [place] or covering provider during after hours [place], for this  patient?   Check the care team in [facility]  and look for a) attending/consulting [place] provider listed and b) the [facility] team listed Log into [email or web address] and use [place]'s universal password to access. If you do not have the password, please contact the hospital operator. Locate the [facility] provider you are looking for under [facility] and page to a number that you can be directly reached. If you still have difficulty reaching the provider, please page the [facility] (Director on Call) for the Hospitalists listed on amion for assistance.  10/09/2022, 6:50 PM

## 2022-10-10 ENCOUNTER — Inpatient Hospital Stay (HOSPITAL_COMMUNITY): Payer: Medicare Other

## 2022-10-10 ENCOUNTER — Encounter (HOSPITAL_COMMUNITY)
Admission: EM | Disposition: A | Discharge status: Home / Self Care | Payer: Self-pay | Source: Home / Self Care | Attending: Internal Medicine

## 2022-10-10 DIAGNOSIS — K56609 Unspecified intestinal obstruction, unspecified as to partial versus complete obstruction: Secondary | ICD-10-CM | POA: Diagnosis not present

## 2022-10-10 LAB — BASIC METABOLIC PANEL WITH GFR
Anion gap: 11 (ref 5–15)
BUN: 63 mg/dL — ABNORMAL HIGH (ref 8–23)
CO2: 21 mmol/L — ABNORMAL LOW (ref 22–32)
Calcium: 9.3 mg/dL (ref 8.9–10.3)
Chloride: 103 mmol/L (ref 98–111)
Creatinine, Ser: 3.2 mg/dL — ABNORMAL HIGH (ref 0.61–1.24)
GFR, Estimated: 19 mL/min — ABNORMAL LOW
Glucose, Bld: 211 mg/dL — ABNORMAL HIGH (ref 70–99)
Potassium: 4.5 mmol/L (ref 3.5–5.1)
Sodium: 135 mmol/L (ref 135–145)

## 2022-10-10 LAB — CBC
HCT: 34.3 % — ABNORMAL LOW (ref 39.0–52.0)
Hemoglobin: 11.3 g/dL — ABNORMAL LOW (ref 13.0–17.0)
MCH: 29.7 pg (ref 26.0–34.0)
MCHC: 32.9 g/dL (ref 30.0–36.0)
MCV: 90.3 fL (ref 80.0–100.0)
Platelets: 198 10*3/uL (ref 150–400)
RBC: 3.8 MIL/uL — ABNORMAL LOW (ref 4.22–5.81)
RDW: 14.3 % (ref 11.5–15.5)
WBC: 7.8 10*3/uL (ref 4.0–10.5)
nRBC: 0 % (ref 0.0–0.2)

## 2022-10-10 LAB — URINALYSIS, ROUTINE W REFLEX MICROSCOPIC
Bacteria, UA: NONE SEEN
Bilirubin Urine: NEGATIVE
Glucose, UA: NEGATIVE mg/dL
Ketones, ur: NEGATIVE mg/dL
Leukocytes,Ua: NEGATIVE
Nitrite: NEGATIVE
Protein, ur: 100 mg/dL — AB
Specific Gravity, Urine: 1.015 (ref 1.005–1.030)
pH: 5 (ref 5.0–8.0)

## 2022-10-10 LAB — GLUCOSE, CAPILLARY
Glucose-Capillary: 160 mg/dL — ABNORMAL HIGH (ref 70–99)
Glucose-Capillary: 187 mg/dL — ABNORMAL HIGH (ref 70–99)
Glucose-Capillary: 209 mg/dL — ABNORMAL HIGH (ref 70–99)
Glucose-Capillary: 215 mg/dL — ABNORMAL HIGH (ref 70–99)
Glucose-Capillary: 215 mg/dL — ABNORMAL HIGH (ref 70–99)
Glucose-Capillary: 223 mg/dL — ABNORMAL HIGH (ref 70–99)

## 2022-10-10 SURGERY — COLECTOMY, RIGHT
Anesthesia: General | Laterality: Right

## 2022-10-10 MED ORDER — FLUTICASONE PROPIONATE 50 MCG/ACT NA SUSP: 2.0000 | NASAL | Status: DC | PRN

## 2022-10-10 MED ORDER — HYDROMORPHONE HCL 1 MG/ML IJ SOLN
0.5000 mg | INTRAMUSCULAR | Status: DC | PRN
  Administered 2022-10-12 – 2022-10-13 (×2): 0.5 mg via INTRAVENOUS
  Administered 2022-10-13 (×3): 1 mg via INTRAVENOUS
  Filled 2022-10-10 (×6): qty 1

## 2022-10-10 MED ORDER — SODIUM CHLORIDE 0.9 % IV SOLN: INTRAVENOUS | Status: DC

## 2022-10-10 MED ORDER — BUDESONIDE 0.25 MG/2ML IN SUSP
0.2500 mg | Freq: Two times a day (BID) | RESPIRATORY_TRACT | Status: DC
  Administered 2022-10-10 – 2022-10-17 (×15): 0.25 mg via RESPIRATORY_TRACT
  Filled 2022-10-10 (×15): qty 2

## 2022-10-10 MED ORDER — NETARSUDIL-LATANOPROST 0.02-0.005 % OP SOLN
1.0000 [drp] | Freq: Every day | OPHTHALMIC | Status: DC
  Administered 2022-10-17 – 2022-10-19 (×3): 1 [drp] via OPHTHALMIC

## 2022-10-10 NOTE — Progress Notes (Signed)
  Transition of Care [facility]) Screening Note   Patient Details  Name: Ivo Ackers Date of Birth: [birth year]   Transition of Care [facility]) CM/SW Contact:    Henrietta Dine, RN Phone Number: 10/10/2022, 12:53 PM    Transition of Care Department [facility]) has reviewed patient and no TOC needs have been identified at this time. We will continue to monitor patient advancement through interdisciplinary progression rounds. If new patient transition needs arise, please place a TOC consult.

## 2022-10-10 NOTE — Progress Notes (Signed)
PROGRESS NOTE    Jacob Guerrero  [id number] DOB: 12/15/1945 DOA: 10/09/2022 PCP: Merryl Hacker, No   Brief Narrative: HPI per Dr. Daryll Drown on 10/09/2022   Jacob Guerrero is a 77 y.o. male with medical history significant of CKD, DM2, HTN, Gout, Chronic bronchitis, GERD, glaucoma, RBBB, CAD with CABG and pacemaker, BPH, anemia, OA, HLD who presents for abdominal pain and found to have an SBO.  He was travelling yesterday with his girlfriend from [place] to visit his brother who lives around this area.  Last night, after travelling, he developed abdominal pain which was sharp, above the belly button and relatively constant.  He had emesis (non bloody) and bowel movements which would briefly improve the pain, but otherwise it was constant.  He noticed no blood loss.  Nothing else made the pain better.  He notes no preceding illness, no sick contacts.  He ate mostly fruit on the drive down here and did not feel that it was very out of the norm for him.  Further symptoms include dizziness when standing.  This will happen often, but not everytime he stands up.  He will feel dizzy and hot and feel like the room is darkening.  It will pass if he gives it time.     ED Course: In the ED, he was found to have a WBC of 14.4, H/H of 11 and 35, Na of 134, bicarb of 19, glucose of 289, BUN of 63, Cr of 3.45 (BUN/Cr ratio is 18).  CT abdomen showed an SBO with possible SB inflammation.  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  He had an NGT placed and surgery was consulted.  Surgery will do a gastrograffin follow through.   Assessment & Plan:   Principal Problem:   SBO (small bowel obstruction) (HCC) Active Problems:   BPH (benign prostatic hyperplasia)   CAD (coronary artery disease)   CKD (chronic kidney disease) stage 3, GFR 30-59 ml/min (HCC)   Diabetes mellitus ([place])   Essential hypertension   GERD (gastroesophageal reflux disease)   Gout   HLD (hyperlipidemia)  #1 small bowel  obstruction - patient admitted with nausea vomiting abdominal distention and abdominal pain.   CT abdomen shows-. Small bowel obstruction with transition point in the right abdomen. There is short segment of wall thickening of small bowel at the transition point. This may be due to adhesions if there is history of prior abdominal surgery inflammation, however the possibility of small bowel stricture, neoplasm or focal enteritis is also considered Minimal ascending colonic diverticulosis. There is also a diverticular changes of the appendix. No acute diverticulitis or appendicitis. Mild prostate enlargement with mass effect on the bladder base.Trace right pleural effusion  NG tube in place patient has not had a BM or flatus.  Small bowel follow-through with very minimal contrast.  Surgery concerned if it was all sucked out with NG tube.  They recommend to continue n.p.o. NG tube and IV fluids. Follow-up KUB in AM. WBC 7.8 from 14.4 from 15.6   #2 AKI on CKD stage IIIb with metabolic acidosis-improving decrease fluids to 75 cc an hour.  #3 BPH restart home meds once able to take p.o.  #4 type 2 diabetes restart home meds once able to take p.o. Continue SSI A1c 7.1 CBG (last 3)  Recent Labs    10/09/22 2352 10/10/22 0407 10/10/22 0813  GLUCAP 170* 215* 223*     #5 history of CAD on Coreg Entresto and Lasix  which is all being on hold will restart once he is able to take p.o.  #6 history of essential hypertension continue IV metoprolol as needed restart home meds as able  #7 GERD on omeprazole  #8 history of gout on allopurinol at home  #9 hyperlipidemia on statins   Estimated body mass index is 24.83 kg/m as calculated from the following:   Height as of this encounter: '6\' 4"'$  (1.93 m).   Weight as of this encounter: 92.5 kg.  DVT prophylaxis: Heparin  Code Status: Full code  family Communication: None at bedside Disposition Plan:  Status is: Inpatient Remains inpatient  appropriate because: Patient has small bowel obstruction with n.p.o. and IV fluids   Consultants:  General surgery  Procedures: NG tube Antimicrobials: None   Subjective:  Patient denies any flatus or bowel movements Feels bloated still Complains of abdominal pain Objective: Vitals:   10/09/22 1742 10/09/22 2059 10/10/22 0154 10/10/22 0529  BP: (!) 138/57 (!) 136/52 (!) 118/57 (!) 126/54  Pulse: 72 70 90 85  Resp: '18 18 18 18  '$ Temp: 98.2 F (36.8 C) 98.6 F (37 C) 99.8 F (37.7 C) 99.7 F (37.6 C)  TempSrc: Oral Oral Oral Oral  SpO2: 99% 95% 98% 95%  Weight:      Height:        Intake/Output Summary (Last 24 hours) at 10/10/2022 [id number] Last data filed at 10/10/2022 0815 Gross per 24 hour  Intake 2038.44 ml  Output 1700 ml  Net 338.44 ml   Filed Weights   10/09/22 1224  Weight: 92.5 kg    Examination: NG tube suctioning in place  General exam: Appears mild distress Respiratory system: Clear to auscultation. Respiratory effort normal. Cardiovascular system: S1 & S2 heard, RRR. No JVD, murmurs, rubs, gallops or clicks. No pedal edema. Gastrointestinal system: Abdomen is distended, soft and tender. No organomegaly or masses felt. Normal bowel sounds heard. Central nervous system: Alert and oriented. No focal neurological deficits. Extremities: Symmetric 5 x 5 power. Skin: No rashes, lesions or ulcers Psychiatry: Judgement and insight appear normal. Mood & affect appropriate.     Data Reviewed: I have personally reviewed following labs and imaging studies  CBC: Recent Labs  Lab 10/09/22 1303 10/09/22 1814 10/10/22 0534  WBC 15.6* 14.4* 7.8  NEUTROABS 14.1*  --   --   HGB 12.3* 11.9* 11.3*  HCT 37.3* 35.9* 34.3*  MCV 91.9 92.5 90.3  PLT 241 230 [id number]   Basic Metabolic Panel: Recent Labs  Lab 10/09/22 1303 10/09/22 1814 10/10/22 0534  NA 134*  --  135  K 5.1  --  4.5  CL 102  --  103  CO2 19*  --  21*  GLUCOSE 289*  --  211*  BUN 63*  --  63*   CREATININE 3.45* 3.36* 3.20*  CALCIUM 10.1  --  9.3   GFR: Estimated Creatinine Clearance: 23.7 mL/min (A) (by C-G formula based on SCr of 3.2 mg/dL (H)). Liver Function Tests: Recent Labs  Lab 10/09/22 1303  AST 24  ALT 12  ALKPHOS 59  BILITOT 0.6  PROT 9.0*  ALBUMIN 4.6   Recent Labs  Lab 10/09/22 1303  LIPASE 50   No results for input(s): "AMMONIA" in the last 168 hours. Coagulation Profile: No results for input(s): "INR", "PROTIME" in the last 168 hours. Cardiac Enzymes: No results for input(s): "CKTOTAL", "CKMB", "CKMBINDEX", "TROPONINI" in the last 168 hours. BNP (last 3 results) No results for input(s): "PROBNP" in the last  8760 hours. HbA1C: Recent Labs    10/09/22 1814  HGBA1C 7.1*   CBG: Recent Labs  Lab 10/09/22 1818 10/09/22 1958 10/09/22 2352 10/10/22 0407 10/10/22 0813  GLUCAP 169* 164* 170* 215* 223*   Lipid Profile: No results for input(s): "CHOL", "HDL", "LDLCALC", "TRIG", "CHOLHDL", "LDLDIRECT" in the last 72 hours. Thyroid Function Tests: No results for input(s): "TSH", "T4TOTAL", "FREET4", "T3FREE", "THYROIDAB" in the last 72 hours. Anemia Panel: No results for input(s): "VITAMINB12", "FOLATE", "FERRITIN", "TIBC", "IRON", "RETICCTPCT" in the last 72 hours. Sepsis Labs: No results for input(s): "PROCALCITON", "LATICACIDVEN" in the last 168 hours.  Recent Results (from the past 240 hour(s))  Resp panel by RT-PCR (RSV, Flu A&B, Covid) Anterior Nasal Swab     Status: None   Collection Time: 10/09/22  1:14 PM   Specimen: Anterior Nasal Swab  Result Value Ref Range Status   SARS Coronavirus 2 by RT PCR NEGATIVE NEGATIVE Final    Comment: (NOTE) SARS-CoV-2 target nucleic acids are NOT DETECTED.  The SARS-CoV-2 RNA is generally detectable in upper respiratory specimens during the acute phase of infection. The lowest concentration of SARS-CoV-2 viral copies this assay can detect is 138 copies/mL. A negative result does not preclude  SARS-Cov-2 infection and should not be used as the sole basis for treatment or other patient management decisions. A negative result may occur with  improper specimen collection/handling, submission of specimen other than nasopharyngeal swab, presence of viral mutation(s) within the areas targeted by this assay, and inadequate number of viral copies(<138 copies/mL). A negative result must be combined with clinical observations, patient history, and epidemiological information. The expected result is Negative.  Fact Sheet for Patients:  EntrepreneurPulse.com.au  Fact Sheet for Healthcare Providers:  IncredibleEmployment.be  This test is no t yet approved or cleared by the [place] FDA and  has been authorized for detection and/or diagnosis of SARS-CoV-2 by FDA under an Emergency Use Authorization (EUA). This EUA will remain  in effect (meaning this test can be used) for the duration of the COVID-19 declaration under Section 564(b)(1) of the Act, 21 U.S.C.section 360bbb-3(b)(1), unless the authorization is terminated  or revoked sooner.       Influenza A by PCR NEGATIVE NEGATIVE Final   Influenza B by PCR NEGATIVE NEGATIVE Final    Comment: (NOTE) The Xpert Xpress SARS-CoV-2/FLU/RSV plus assay is intended as an aid in the diagnosis of influenza from Nasopharyngeal swab specimens and should not be used as a sole basis for treatment. Nasal washings and aspirates are unacceptable for Xpert Xpress SARS-CoV-2/FLU/RSV testing.  Fact Sheet for Patients: EntrepreneurPulse.com.au  Fact Sheet for Healthcare Providers: IncredibleEmployment.be  This test is not yet approved or cleared by the [place] FDA and has been authorized for detection and/or diagnosis of SARS-CoV-2 by FDA under an Emergency Use Authorization (EUA). This EUA will remain in effect (meaning this test can be used) for the duration of  the COVID-19 declaration under Section 564(b)(1) of the Act, 21 U.S.C. section 360bbb-3(b)(1), unless the authorization is terminated or revoked.     Resp Syncytial Virus by PCR NEGATIVE NEGATIVE Final    Comment: (NOTE) Fact Sheet for Patients: EntrepreneurPulse.com.au  Fact Sheet for Healthcare Providers: IncredibleEmployment.be  This test is not yet approved or cleared by the [place] FDA and has been authorized for detection and/or diagnosis of SARS-CoV-2 by FDA under an Emergency Use Authorization (EUA). This EUA will remain in effect (meaning this test can be used) for the duration of the COVID-19 declaration  under Section 564(b)(1) of the Act, 21 U.S.C. section 360bbb-3(b)(1), unless the authorization is terminated or revoked.  Performed at [facility], [place] [address] [place], [place] [address]          Radiology Studies: DG Abd Portable 1V-Small Bowel Obstruction Protocol-initial, 8 hr delay  Result Date: 10/10/2022 CLINICAL DATA:  Small-bowel obstruction EXAM: PORTABLE ABDOMEN - 1 VIEW COMPARISON:  None Available. FINDINGS: There is a nasogastric tube with tip and side port in the stomach. There is a small amount of intragastric contrast material. No distal contrast. Mildly dilated small bowel in the left hemiabdomen. IMPRESSION: 1. Mildly dilated small bowel in the left hemiabdomen. 2. Small amount of intragastric contrast material. No distal contrast. Electronically Signed   By: Ulyses Jarred M.D.   On: 10/10/2022 02:52   DG Abd Portable 1V-Small Bowel Protocol-Position Verification  Result Date: 10/09/2022 CLINICAL DATA:  Tube placement EXAM: PORTABLE ABDOMEN - 1 VIEW limited for tube placement COMPARISON:  None Available. FINDINGS: Limited x-ray of the upper abdomen and lower chest were NG tube placement demonstrates tube overlying the fundus of the stomach. Side hole beneath the diaphragm. Elsewhere there  are some dilated loops of small bowel in the left upper quadrant of the abdomen. Defibrillator. IMPRESSION: Limited x-ray for tube placement has tip overlying the fundus of the stomach Electronically Signed   By: Jill Side M.D.   On: 10/09/2022 17:34   CT ABDOMEN PELVIS WO CONTRAST  Result Date: 10/09/2022 CLINICAL DATA:  Acute abdominal pain. Patient reports pain and distension since last night. Nausea, vomiting, diarrhea. EXAM: CT ABDOMEN AND PELVIS WITHOUT CONTRAST TECHNIQUE: Multidetector CT imaging of the abdomen and pelvis was performed following the standard protocol without IV contrast. RADIATION DOSE REDUCTION: This exam was performed according to the departmental dose-optimization program which includes automated exposure control, adjustment of the mA and/or kV according to patient size and/or use of iterative reconstruction technique. COMPARISON:  None Available. FINDINGS: Lower chest: Pacemaker wires partially visualized, mild cardiomegaly. Trace right pleural effusion. Hepatobiliary: Subcentimeter subcapsular low-density in the posterior right hepatic lobe is too small to accurately characterize but likely small cyst. No evidence of solid liver lesion on this unenhanced exam. Gallbladder physiologically distended, no calcified stone. No biliary dilatation. Pancreas: No ductal dilatation or inflammation. Spleen: Normal in size without focal abnormality. Adrenals/Urinary Tract: No adrenal nodule. There is slight adrenal thickening on the left. Bilateral renal parenchymal thinning. No hydronephrosis or renal calculi. No evidence of solid renal lesion. Both ureters are decompressed. The urinary bladder is partially distended, grossly normal for degree of distension. Stomach/Bowel: Stomach is partially distended with fluid. Small bowel is dilated and fluid-filled. There is fecalization of small bowel contents within small bowel loop in the right abdomen. Transition from dilated to nondilated small  bowel just distal to the fecalized segment where there is short segment of wall thickening. Transition on series 2, image 55 and series 5, image 34. There is trace mesenteric edema and free fluid but no small bowel pneumatosis, free air or perforation. Small bowel distal to the transition point is decompressed. Suspected diverticulosis of the appendix but no appendicitis or diverticulitis. Additional diverticular seen about the cecum. Majority of the colon is nondistended. No colonic inflammatory change. Vascular/Lymphatic: Aortic atherosclerosis without aneurysm. No portal venous or mesenteric gas. No bulky abdominopelvic adenopathy. Reproductive: Mild prostate enlargement with mass effect on the bladder base. Other: Mesenteric edema with small amount of mesenteric free fluid in the small bowel mesentery. No  perforation, free air or focal fluid collection. There is fat within both inguinal canals. Musculoskeletal: There are 4 non-rib-bearing lumbar vertebra. Moderate lower lumbar facet hypertrophy. Bilateral hip osteoarthritis. There are no acute or suspicious osseous abnormalities. IMPRESSION: 1. Small bowel obstruction with transition point in the right abdomen. There is short segment of wall thickening of small bowel at the transition point. This may be due to adhesions if there is history of prior abdominal surgery inflammation, however the possibility of small bowel stricture, neoplasm or focal enteritis is also considered. 2. Minimal ascending colonic diverticulosis. There is also a diverticular changes of the appendix. No acute diverticulitis or appendicitis. 3. Mild prostate enlargement with mass effect on the bladder base. 4. Trace right pleural effusion. Aortic Atherosclerosis (ICD10-I70.0). Electronically Signed   By: Keith Rake M.D.   On: 10/09/2022 15:43        Scheduled Meds:  heparin injection (subcutaneous)  5,000 Units Subcutaneous Q8H   insulin aspart  0-15 Units Subcutaneous Q4H    sodium chloride flush  3 mL Intravenous Q12H   Continuous Infusions:  lactated ringers 125 mL/hr at 10/10/22 0536     LOS: 1 day    Time spent: 30 min  Georgette Shell, MD 10/10/2022, 9:38 AM

## 2022-10-10 NOTE — Progress Notes (Signed)
Subjective/Chief Complaint: He reports no abdominal pain this morning Still has had no flatus or BM   Objective: Vital signs in last 24 hours: Temp:  [97.6 F (36.4 C)-99.8 F (37.7 C)] 99.7 F (37.6 C) (02/24 0529) Pulse Rate:  [63-90] 85 (02/24 0529) Resp:  [16-20] 18 (02/24 0529) BP: (102-138)/(52-67) 126/54 (02/24 0529) SpO2:  [95 %-100 %] 95 % (02/24 0529) Weight:  [92.5 kg] 92.5 kg (02/23 1224)    Intake/Output from previous day: 02/23 0701 - 02/24 0700 In: 2038.4 [I.V.:1522.1; IV Piggyback:516.4] Out: 1550 [Urine:950; Emesis/NG output:600] Intake/Output this shift: Total I/O In: -  Out: 150 [Urine:150]  Exam: Awake and alert Comfortable Abdomen soft, obese, non-tender  Lab Results:  Recent Labs    10/09/22 1814 10/10/22 0534  WBC 14.4* 7.8  HGB 11.9* 11.3*  HCT 35.9* 34.3*  PLT 230 198   BMET Recent Labs    10/09/22 1303 10/09/22 1814 10/10/22 0534  NA 134*  --  135  K 5.1  --  4.5  CL 102  --  103  CO2 19*  --  21*  GLUCOSE 289*  --  211*  BUN 63*  --  63*  CREATININE 3.45* 3.36* 3.20*  CALCIUM 10.1  --  9.3   PT/INR No results for input(s): "LABPROT", "INR" in the last 72 hours. ABG No results for input(s): "PHART", "HCO3" in the last 72 hours.  Invalid input(s): "PCO2", "PO2"  Studies/Results: DG Abd Portable 1V-Small Bowel Obstruction Protocol-initial, 8 hr delay  Result Date: 10/10/2022 CLINICAL DATA:  Small-bowel obstruction EXAM: PORTABLE ABDOMEN - 1 VIEW COMPARISON:  None Available. FINDINGS: There is a nasogastric tube with tip and side port in the stomach. There is a small amount of intragastric contrast material. No distal contrast. Mildly dilated small bowel in the left hemiabdomen. IMPRESSION: 1. Mildly dilated small bowel in the left hemiabdomen. 2. Small amount of intragastric contrast material. No distal contrast. Electronically Signed   By: Ulyses Jarred M.D.   On: 10/10/2022 02:52   DG Abd Portable 1V-Small Bowel  Protocol-Position Verification  Result Date: 10/09/2022 CLINICAL DATA:  Tube placement EXAM: PORTABLE ABDOMEN - 1 VIEW limited for tube placement COMPARISON:  None Available. FINDINGS: Limited x-ray of the upper abdomen and lower chest were NG tube placement demonstrates tube overlying the fundus of the stomach. Side hole beneath the diaphragm. Elsewhere there are some dilated loops of small bowel in the left upper quadrant of the abdomen. Defibrillator. IMPRESSION: Limited x-ray for tube placement has tip overlying the fundus of the stomach Electronically Signed   By: Jill Side M.D.   On: 10/09/2022 17:34   CT ABDOMEN PELVIS WO CONTRAST  Result Date: 10/09/2022 CLINICAL DATA:  Acute abdominal pain. Patient reports pain and distension since last night. Nausea, vomiting, diarrhea. EXAM: CT ABDOMEN AND PELVIS WITHOUT CONTRAST TECHNIQUE: Multidetector CT imaging of the abdomen and pelvis was performed following the standard protocol without IV contrast. RADIATION DOSE REDUCTION: This exam was performed according to the departmental dose-optimization program which includes automated exposure control, adjustment of the mA and/or kV according to patient size and/or use of iterative reconstruction technique. COMPARISON:  None Available. FINDINGS: Lower chest: Pacemaker wires partially visualized, mild cardiomegaly. Trace right pleural effusion. Hepatobiliary: Subcentimeter subcapsular low-density in the posterior right hepatic lobe is too small to accurately characterize but likely small cyst. No evidence of solid liver lesion on this unenhanced exam. Gallbladder physiologically distended, no calcified stone. No biliary dilatation. Pancreas: No ductal dilatation or  inflammation. Spleen: Normal in size without focal abnormality. Adrenals/Urinary Tract: No adrenal nodule. There is slight adrenal thickening on the left. Bilateral renal parenchymal thinning. No hydronephrosis or renal calculi. No evidence of solid  renal lesion. Both ureters are decompressed. The urinary bladder is partially distended, grossly normal for degree of distension. Stomach/Bowel: Stomach is partially distended with fluid. Small bowel is dilated and fluid-filled. There is fecalization of small bowel contents within small bowel loop in the right abdomen. Transition from dilated to nondilated small bowel just distal to the fecalized segment where there is short segment of wall thickening. Transition on series 2, image 55 and series 5, image 34. There is trace mesenteric edema and free fluid but no small bowel pneumatosis, free air or perforation. Small bowel distal to the transition point is decompressed. Suspected diverticulosis of the appendix but no appendicitis or diverticulitis. Additional diverticular seen about the cecum. Majority of the colon is nondistended. No colonic inflammatory change. Vascular/Lymphatic: Aortic atherosclerosis without aneurysm. No portal venous or mesenteric gas. No bulky abdominopelvic adenopathy. Reproductive: Mild prostate enlargement with mass effect on the bladder base. Other: Mesenteric edema with small amount of mesenteric free fluid in the small bowel mesentery. No perforation, free air or focal fluid collection. There is fat within both inguinal canals. Musculoskeletal: There are 4 non-rib-bearing lumbar vertebra. Moderate lower lumbar facet hypertrophy. Bilateral hip osteoarthritis. There are no acute or suspicious osseous abnormalities. IMPRESSION: 1. Small bowel obstruction with transition point in the right abdomen. There is short segment of wall thickening of small bowel at the transition point. This may be due to adhesions if there is history of prior abdominal surgery inflammation, however the possibility of small bowel stricture, neoplasm or focal enteritis is also considered. 2. Minimal ascending colonic diverticulosis. There is also a diverticular changes of the appendix. No acute diverticulitis or  appendicitis. 3. Mild prostate enlargement with mass effect on the bladder base. 4. Trace right pleural effusion. Aortic Atherosclerosis (ICD10-I70.0). Electronically Signed   By: Keith Rake M.D.   On: 10/09/2022 15:43    Anti-infectives: Anti-infectives (From admission, onward)    None       Assessment/Plan: SBO vs gastroenteritis  Films this morning show minimal contrast.  I am worried it was all sucked out of the NG.  Will continue NPO and NG WBC is now normal Repeat plain films in the morning   LOS: 1 day    Coralie Keens 10/10/2022

## 2022-10-11 ENCOUNTER — Inpatient Hospital Stay (HOSPITAL_COMMUNITY): Payer: Medicare Other

## 2022-10-11 DIAGNOSIS — K56609 Unspecified intestinal obstruction, unspecified as to partial versus complete obstruction: Secondary | ICD-10-CM | POA: Diagnosis not present

## 2022-10-11 LAB — COMPREHENSIVE METABOLIC PANEL
ALT: 11 U/L (ref 0–44)
AST: 15 U/L (ref 15–41)
Albumin: 3.5 g/dL (ref 3.5–5.0)
Alkaline Phosphatase: 43 U/L (ref 38–126)
Anion gap: 12 (ref 5–15)
BUN: 58 mg/dL — ABNORMAL HIGH (ref 8–23)
CO2: 23 mmol/L (ref 22–32)
Calcium: 9.2 mg/dL (ref 8.9–10.3)
Chloride: 104 mmol/L (ref 98–111)
Creatinine, Ser: 2.93 mg/dL — ABNORMAL HIGH (ref 0.61–1.24)
GFR, Estimated: 21 mL/min — ABNORMAL LOW (ref >60)
Glucose, Bld: 178 mg/dL — ABNORMAL HIGH (ref 70–99)
Potassium: 4.4 mmol/L (ref 3.5–5.1)
Sodium: 139 mmol/L (ref 135–145)
Total Bilirubin: 0.6 mg/dL (ref 0.3–1.2)
Total Protein: 6.6 g/dL (ref 6.5–8.1)

## 2022-10-11 LAB — CBC
HCT: 33 % — ABNORMAL LOW (ref 39.0–52.0)
Hemoglobin: 10.5 g/dL — ABNORMAL LOW (ref 13.0–17.0)
MCH: 29.4 pg (ref 26.0–34.0)
MCHC: 31.8 g/dL (ref 30.0–36.0)
MCV: 92.4 fL (ref 80.0–100.0)
Platelets: 169 10*3/uL (ref 150–400)
RBC: 3.57 MIL/uL — ABNORMAL LOW (ref 4.22–5.81)
RDW: 14.4 % (ref 11.5–15.5)
WBC: 9 10*3/uL (ref 4.0–10.5)
nRBC: 0 % (ref 0.0–0.2)

## 2022-10-11 LAB — GLUCOSE, CAPILLARY
Glucose-Capillary: 177 mg/dL — ABNORMAL HIGH (ref 70–99)
Glucose-Capillary: 177 mg/dL — ABNORMAL HIGH (ref 70–99)
Glucose-Capillary: 172 mg/dL — ABNORMAL HIGH (ref 70–99)
Glucose-Capillary: 170 mg/dL — ABNORMAL HIGH (ref 70–99)
Glucose-Capillary: 145 mg/dL — ABNORMAL HIGH (ref 70–99)
Glucose-Capillary: 139 mg/dL — ABNORMAL HIGH (ref 70–99)

## 2022-10-11 MED ORDER — ALLOPURINOL 300 MG PO TABS: 300.0000 mg | ORAL_TABLET | Freq: Every day | ORAL | Status: DC

## 2022-10-11 MED ORDER — BISACODYL 10 MG RE SUPP
10.0000 mg | Freq: Once | RECTAL | Status: DC
  Filled 2022-10-11: qty 1

## 2022-10-11 MED ORDER — DORZOLAMIDE HCL 2 % OP SOLN
1.0000 [drp] | Freq: Three times a day (TID) | OPHTHALMIC | Status: DC
  Administered 2022-10-11 – 2022-10-20 (×25): 1 [drp] via OPHTHALMIC
  Filled 2022-10-11: qty 10

## 2022-10-11 MED ORDER — DIATRIZOATE MEGLUMINE & SODIUM 66-10 % PO SOLN
90.0000 mL | Freq: Once | ORAL | Status: AC
  Administered 2022-10-11: 90 mL via NASOGASTRIC
  Filled 2022-10-11: qty 90

## 2022-10-11 MED ORDER — ALBUTEROL SULFATE (2.5 MG/3ML) 0.083% IN NEBU: 2.5000 mg | INHALATION_SOLUTION | RESPIRATORY_TRACT | Status: DC | PRN

## 2022-10-11 MED ORDER — FINASTERIDE 5 MG PO TABS: 5.0000 mg | ORAL_TABLET | Freq: Every day | ORAL | Status: DC

## 2022-10-11 NOTE — Progress Notes (Signed)
PROGRESS NOTE    Jacob Guerrero  [id number] DOB: 1946/03/21 DOA: 10/09/2022 PCP: Merryl Hacker, No   Brief Narrative: HPI per Dr. Daryll Drown on 10/09/2022   Jacob Guerrero is a 77 y.o. male with medical history significant of CKD, DM2, HTN, Gout, Chronic bronchitis, GERD, glaucoma, RBBB, CAD with CABG and pacemaker, BPH, anemia, OA, HLD who presents for abdominal pain and found to have an SBO.  He was travelling yesterday with his girlfriend from [place] to visit his brother who lives around this area.  Last night, after travelling, he developed abdominal pain which was sharp, above the belly button and relatively constant.  He had emesis (non bloody) and bowel movements which would briefly improve the pain, but otherwise it was constant.  He noticed no blood loss.  Nothing else made the pain better.  He notes no preceding illness, no sick contacts.  He ate mostly fruit on the drive down here and did not feel that it was very out of the norm for him.  Further symptoms include dizziness when standing.  This will happen often, but not everytime he stands up.  He will feel dizzy and hot and feel like the room is darkening.  It will pass if he gives it time.     ED Course: In the ED, he was found to have a WBC of 14.4, H/H of 11 and 35, Na of 134, bicarb of 19, glucose of 289, BUN of 63, Cr of 3.45 (BUN/Cr ratio is 18).  CT abdomen showed an SBO with possible SB inflammation.  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  He had an NGT placed and surgery was consulted.  Surgery will do a gastrograffin follow through.   Assessment & Plan:   Principal Problem:   SBO (small bowel obstruction) (HCC) Active Problems:   BPH (benign prostatic hyperplasia)   CAD (coronary artery disease)   CKD (chronic kidney disease) stage 3, GFR 30-59 ml/min (HCC)   Diabetes mellitus ([place])   Essential hypertension   GERD (gastroesophageal reflux disease)   Gout   HLD (hyperlipidemia)  #1 small bowel  obstruction - patient admitted with nausea vomiting abdominal distention and abdominal pain.   CT abdomen shows-. Small bowel obstruction with transition point in the right abdomen. There is short segment of wall thickening of small bowel at the transition point. This may be due to adhesions if there is history of prior abdominal surgery inflammation, however the possibility of small bowel stricture, neoplasm or focal enteritis is also considered Minimal ascending colonic diverticulosis. There is also a diverticular changes of the appendix. No acute diverticulitis or appendicitis. Mild prostate enlargement with mass effect on the bladder base.Trace right pleural effusion KUB 2/25-Persisting gaseous distention of small bowel in the left abdomen, although slightly improved from yesterday's exam. Removal of enteric tube.NG tube in place patient has not had a BM or flatus.  Small bowel follow-through with very minimal contrast.  Surgery concerned if it was all sucked out with NG tube.   LEUKOCYTOSIS resolved   #2 AKI on CKD stage IIIb with metabolic acidosis-improving decrease fluids to 75 cc an hour. Cr 2.9 from 3.45 on admit Check bladder scan  #3 BPH restart home meds   #4 type 2 diabetes restart home meds once able to take p.o. Continue SSI A1c 7.1 CBG (last 3)  Recent Labs    10/11/22 0413 10/11/22 0736 10/11/22 1135  GLUCAP 177* 177* 172*    #5 history of  CAD on Coreg Entresto and Lasix which is all being on hold will restart once he is able to take p.o.  #6 history of essential hypertension continue IV metoprolol as needed restart home meds as able  #7 GERD on omeprazole  #8 history of gout on allopurinol at home  #9 hyperlipidemia on statins   Estimated body mass index is 24.83 kg/m as calculated from the following:   Height as of this encounter: '6\' 4"'$  (1.93 m).   Weight as of this encounter: 92.5 kg.  DVT prophylaxis: Heparin  Code Status: Full code  family  Communication: None at bedside Disposition Plan:  Status is: Inpatient Remains inpatient appropriate because: Patient has small bowel obstruction with n.p.o. and IV fluids   Consultants:  General surgery  Procedures: NG tube Antimicrobials: None   Subjective:  Patient reports flatus and bm. Feels bloated still Complains of abdominal pain Objective: Vitals:   10/10/22 1350 10/10/22 1938 10/10/22 2042 10/11/22 0826  BP: 139/64  (!) 136/57   Pulse: 74  77   Resp: 17  16   Temp: 98.7 F (37.1 C)  99.3 F (37.4 C)   TempSrc: Oral  Oral   SpO2: 96% 98% 95% 96%  Weight:      Height:        Intake/Output Summary (Last 24 hours) at 10/11/2022 1230 Last data filed at 10/11/2022 1000 Gross per 24 hour  Intake 1531.58 ml  Output 1080 ml  Net 451.58 ml    Filed Weights   10/09/22 1224  Weight: 92.5 kg    Examination: NG tube suctioning in place  General exam: Appears mild distress Respiratory system: Clear to auscultation. Respiratory effort normal. Cardiovascular system: S1 & S2 heard, RRR. No JVD, murmurs, rubs, gallops or clicks. No pedal edema. Gastrointestinal system: Abdomen is distended, soft and tender. No organomegaly or masses felt. Normal bowel sounds heard. Central nervous system: Alert and oriented. No focal neurological deficits. Extremities: Symmetric 5 x 5 power. Skin: No rashes, lesions or ulcers Psychiatry: Judgement and insight appear normal. Mood & affect appropriate.     Data Reviewed: I have personally reviewed following labs and imaging studies  CBC: Recent Labs  Lab 10/09/22 1303 10/09/22 1814 10/10/22 0534 10/11/22 0817  WBC 15.6* 14.4* 7.8 9.0  NEUTROABS 14.1*  --   --   --   HGB 12.3* 11.9* 11.3* 10.5*  HCT 37.3* 35.9* 34.3* 33.0*  MCV 91.9 92.5 90.3 92.4  PLT 241 230 198 [id number]    Basic Metabolic Panel: Recent Labs  Lab 10/09/22 1303 10/09/22 1814 10/10/22 0534 10/11/22 0817  NA 134*  --  135 139  K 5.1  --  4.5 4.4  CL 102   --  103 104  CO2 19*  --  21* 23  GLUCOSE 289*  --  211* 178*  BUN 63*  --  63* 58*  CREATININE 3.45* 3.36* 3.20* 2.93*  CALCIUM 10.1  --  9.3 9.2    GFR: Estimated Creatinine Clearance: 25.9 mL/min (A) (by C-G formula based on SCr of 2.93 mg/dL (H)). Liver Function Tests: Recent Labs  Lab 10/09/22 1303 10/11/22 0817  AST 24 15  ALT 12 11  ALKPHOS 59 43  BILITOT 0.6 0.6  PROT 9.0* 6.6  ALBUMIN 4.6 3.5    Recent Labs  Lab 10/09/22 1303  LIPASE 50    No results for input(s): "AMMONIA" in the last 168 hours. Coagulation Profile: No results for input(s): "INR", "PROTIME" in the last 168  hours. Cardiac Enzymes: No results for input(s): "CKTOTAL", "CKMB", "CKMBINDEX", "TROPONINI" in the last 168 hours. BNP (last 3 results) No results for input(s): "PROBNP" in the last 8760 hours. HbA1C: Recent Labs    10/09/22 1814  HGBA1C 7.1*    CBG: Recent Labs  Lab 10/10/22 1932 10/10/22 2356 10/11/22 0413 10/11/22 0736 10/11/22 1135  GLUCAP 160* 215* 177* 177* 172*    Lipid Profile: No results for input(s): "CHOL", "HDL", "LDLCALC", "TRIG", "CHOLHDL", "LDLDIRECT" in the last 72 hours. Thyroid Function Tests: No results for input(s): "TSH", "T4TOTAL", "FREET4", "T3FREE", "THYROIDAB" in the last 72 hours. Anemia Panel: No results for input(s): "VITAMINB12", "FOLATE", "FERRITIN", "TIBC", "IRON", "RETICCTPCT" in the last 72 hours. Sepsis Labs: No results for input(s): "PROCALCITON", "LATICACIDVEN" in the last 168 hours.  Recent Results (from the past 240 hour(s))  Resp panel by RT-PCR (RSV, Flu A&B, Covid) Anterior Nasal Swab     Status: None   Collection Time: 10/09/22  1:14 PM   Specimen: Anterior Nasal Swab  Result Value Ref Range Status   SARS Coronavirus 2 by RT PCR NEGATIVE NEGATIVE Final    Comment: (NOTE) SARS-CoV-2 target nucleic acids are NOT DETECTED.  The SARS-CoV-2 RNA is generally detectable in upper respiratory specimens during the acute phase of  infection. The lowest concentration of SARS-CoV-2 viral copies this assay can detect is 138 copies/mL. A negative result does not preclude SARS-Cov-2 infection and should not be used as the sole basis for treatment or other patient management decisions. A negative result may occur with  improper specimen collection/handling, submission of specimen other than nasopharyngeal swab, presence of viral mutation(s) within the areas targeted by this assay, and inadequate number of viral copies(<138 copies/mL). A negative result must be combined with clinical observations, patient history, and epidemiological information. The expected result is Negative.  Fact Sheet for Patients:  EntrepreneurPulse.com.au  Fact Sheet for Healthcare Providers:  IncredibleEmployment.be  This test is no t yet approved or cleared by the [place] FDA and  has been authorized for detection and/or diagnosis of SARS-CoV-2 by FDA under an Emergency Use Authorization (EUA). This EUA will remain  in effect (meaning this test can be used) for the duration of the COVID-19 declaration under Section 564(b)(1) of the Act, 21 U.S.C.section 360bbb-3(b)(1), unless the authorization is terminated  or revoked sooner.       Influenza A by PCR NEGATIVE NEGATIVE Final   Influenza B by PCR NEGATIVE NEGATIVE Final    Comment: (NOTE) The Xpert Xpress SARS-CoV-2/FLU/RSV plus assay is intended as an aid in the diagnosis of influenza from Nasopharyngeal swab specimens and should not be used as a sole basis for treatment. Nasal washings and aspirates are unacceptable for Xpert Xpress SARS-CoV-2/FLU/RSV testing.  Fact Sheet for Patients: EntrepreneurPulse.com.au  Fact Sheet for Healthcare Providers: IncredibleEmployment.be  This test is not yet approved or cleared by the [place] FDA and has been authorized for detection and/or diagnosis of SARS-CoV-2  by FDA under an Emergency Use Authorization (EUA). This EUA will remain in effect (meaning this test can be used) for the duration of the COVID-19 declaration under Section 564(b)(1) of the Act, 21 U.S.C. section 360bbb-3(b)(1), unless the authorization is terminated or revoked.     Resp Syncytial Virus by PCR NEGATIVE NEGATIVE Final    Comment: (NOTE) Fact Sheet for Patients: EntrepreneurPulse.com.au  Fact Sheet for Healthcare Providers: IncredibleEmployment.be  This test is not yet approved or cleared by the [place] FDA and has been authorized for detection and/or  diagnosis of SARS-CoV-2 by FDA under an Emergency Use Authorization (EUA). This EUA will remain in effect (meaning this test can be used) for the duration of the COVID-19 declaration under Section 564(b)(1) of the Act, 21 U.S.C. section 360bbb-3(b)(1), unless the authorization is terminated or revoked.  Performed at [facility], [place] [address] [place], [place] [address]          Radiology Studies: DG Abd Portable 1V  Result Date: 10/11/2022 CLINICAL DATA:  Small bowel obstruction. EXAM: PORTABLE ABDOMEN - 1 VIEW COMPARISON:  Radiograph yesterday FINDINGS: Removal of enteric tube. Persisting gaseous distention of small bowel in the left abdomen, although slightly improved from yesterday's exam. No obvious free air on the supine views. No other interval change from prior. IMPRESSION: Persisting gaseous distention of small bowel in the left abdomen, although slightly improved from yesterday's exam. Removal of enteric tube. Electronically Signed   By: Keith Rake M.D.   On: 10/11/2022 12:19   DG Abd Portable 1V-Small Bowel Obstruction Protocol-initial, 8 hr delay  Result Date: 10/10/2022 CLINICAL DATA:  Small-bowel obstruction EXAM: PORTABLE ABDOMEN - 1 VIEW COMPARISON:  None Available. FINDINGS: There is a nasogastric tube with tip and side port in the  stomach. There is a small amount of intragastric contrast material. No distal contrast. Mildly dilated small bowel in the left hemiabdomen. IMPRESSION: 1. Mildly dilated small bowel in the left hemiabdomen. 2. Small amount of intragastric contrast material. No distal contrast. Electronically Signed   By: Ulyses Jarred M.D.   On: 10/10/2022 02:52   DG Abd Portable 1V-Small Bowel Protocol-Position Verification  Result Date: 10/09/2022 CLINICAL DATA:  Tube placement EXAM: PORTABLE ABDOMEN - 1 VIEW limited for tube placement COMPARISON:  None Available. FINDINGS: Limited x-ray of the upper abdomen and lower chest were NG tube placement demonstrates tube overlying the fundus of the stomach. Side hole beneath the diaphragm. Elsewhere there are some dilated loops of small bowel in the left upper quadrant of the abdomen. Defibrillator. IMPRESSION: Limited x-ray for tube placement has tip overlying the fundus of the stomach Electronically Signed   By: Jill Side M.D.   On: 10/09/2022 17:34   CT ABDOMEN PELVIS WO CONTRAST  Result Date: 10/09/2022 CLINICAL DATA:  Acute abdominal pain. Patient reports pain and distension since last night. Nausea, vomiting, diarrhea. EXAM: CT ABDOMEN AND PELVIS WITHOUT CONTRAST TECHNIQUE: Multidetector CT imaging of the abdomen and pelvis was performed following the standard protocol without IV contrast. RADIATION DOSE REDUCTION: This exam was performed according to the departmental dose-optimization program which includes automated exposure control, adjustment of the mA and/or kV according to patient size and/or use of iterative reconstruction technique. COMPARISON:  None Available. FINDINGS: Lower chest: Pacemaker wires partially visualized, mild cardiomegaly. Trace right pleural effusion. Hepatobiliary: Subcentimeter subcapsular low-density in the posterior right hepatic lobe is too small to accurately characterize but likely small cyst. No evidence of solid liver lesion on this  unenhanced exam. Gallbladder physiologically distended, no calcified stone. No biliary dilatation. Pancreas: No ductal dilatation or inflammation. Spleen: Normal in size without focal abnormality. Adrenals/Urinary Tract: No adrenal nodule. There is slight adrenal thickening on the left. Bilateral renal parenchymal thinning. No hydronephrosis or renal calculi. No evidence of solid renal lesion. Both ureters are decompressed. The urinary bladder is partially distended, grossly normal for degree of distension. Stomach/Bowel: Stomach is partially distended with fluid. Small bowel is dilated and fluid-filled. There is fecalization of small bowel contents within small bowel loop in the right abdomen.  Transition from dilated to nondilated small bowel just distal to the fecalized segment where there is short segment of wall thickening. Transition on series 2, image 55 and series 5, image 34. There is trace mesenteric edema and free fluid but no small bowel pneumatosis, free air or perforation. Small bowel distal to the transition point is decompressed. Suspected diverticulosis of the appendix but no appendicitis or diverticulitis. Additional diverticular seen about the cecum. Majority of the colon is nondistended. No colonic inflammatory change. Vascular/Lymphatic: Aortic atherosclerosis without aneurysm. No portal venous or mesenteric gas. No bulky abdominopelvic adenopathy. Reproductive: Mild prostate enlargement with mass effect on the bladder base. Other: Mesenteric edema with small amount of mesenteric free fluid in the small bowel mesentery. No perforation, free air or focal fluid collection. There is fat within both inguinal canals. Musculoskeletal: There are 4 non-rib-bearing lumbar vertebra. Moderate lower lumbar facet hypertrophy. Bilateral hip osteoarthritis. There are no acute or suspicious osseous abnormalities. IMPRESSION: 1. Small bowel obstruction with transition point in the right abdomen. There is short  segment of wall thickening of small bowel at the transition point. This may be due to adhesions if there is history of prior abdominal surgery inflammation, however the possibility of small bowel stricture, neoplasm or focal enteritis is also considered. 2. Minimal ascending colonic diverticulosis. There is also a diverticular changes of the appendix. No acute diverticulitis or appendicitis. 3. Mild prostate enlargement with mass effect on the bladder base. 4. Trace right pleural effusion. Aortic Atherosclerosis (ICD10-I70.0). Electronically Signed   By: Keith Rake M.D.   On: 10/09/2022 15:43        Scheduled Meds:  budesonide  0.25 mg Nebulization BID   heparin injection (subcutaneous)  5,000 Units Subcutaneous Q8H   insulin aspart  0-15 Units Subcutaneous Q4H   Netarsudil-Latanoprost  1 drop Both Eyes QHS   sodium chloride flush  3 mL Intravenous Q12H   Continuous Infusions:  lactated ringers 75 mL/hr at 10/10/22 1041     LOS: 2 days    Time spent: 36 min  Georgette Shell, MD 10/11/2022, 12:30 PM

## 2022-10-11 NOTE — Progress Notes (Signed)
Subjective/Chief Complaint: No complaints. Having bm's and flatus but small   Objective: Vital signs in last 24 hours: Temp:  [98.7 F (37.1 C)-99.3 F (37.4 C)] 99.3 F (37.4 C) (02/24 2042) Pulse Rate:  [74-77] 77 (02/24 2042) Resp:  [16-17] 16 (02/24 2042) BP: (136-139)/(57-64) 136/57 (02/24 2042) SpO2:  [95 %-98 %] 96 % (02/25 0826) Last BM Date : 10/10/22  Intake/Output from previous day: 02/24 0701 - 02/25 0700 In: 2018.9 [I.V.:2018.9] Out: 1330 [Urine:1280; Emesis/NG output:50] Intake/Output this shift: Total I/O In: -  Out: 150 [Urine:150]  General appearance: alert and cooperative Resp: clear to auscultation bilaterally Cardio: regular rate and rhythm GI: soft, flat. Mild tenderness on right   Lab Results:  Recent Labs    10/10/22 0534 10/11/22 0817  WBC 7.8 9.0  HGB 11.3* 10.5*  HCT 34.3* 33.0*  PLT 198 169   BMET Recent Labs    10/10/22 0534 10/11/22 0817  NA 135 139  K 4.5 4.4  CL 103 104  CO2 21* 23  GLUCOSE 211* 178*  BUN 63* 58*  CREATININE 3.20* 2.93*  CALCIUM 9.3 9.2   PT/INR No results for input(s): "LABPROT", "INR" in the last 72 hours. ABG No results for input(s): "PHART", "HCO3" in the last 72 hours.  Invalid input(s): "PCO2", "PO2"  Studies/Results: DG Abd Portable 1V-Small Bowel Obstruction Protocol-initial, 8 hr delay  Result Date: 10/10/2022 CLINICAL DATA:  Small-bowel obstruction EXAM: PORTABLE ABDOMEN - 1 VIEW COMPARISON:  None Available. FINDINGS: There is a nasogastric tube with tip and side port in the stomach. There is a small amount of intragastric contrast material. No distal contrast. Mildly dilated small bowel in the left hemiabdomen. IMPRESSION: 1. Mildly dilated small bowel in the left hemiabdomen. 2. Small amount of intragastric contrast material. No distal contrast. Electronically Signed   By: Ulyses Jarred M.D.   On: 10/10/2022 02:52   DG Abd Portable 1V-Small Bowel Protocol-Position Verification  Result  Date: 10/09/2022 CLINICAL DATA:  Tube placement EXAM: PORTABLE ABDOMEN - 1 VIEW limited for tube placement COMPARISON:  None Available. FINDINGS: Limited x-ray of the upper abdomen and lower chest were NG tube placement demonstrates tube overlying the fundus of the stomach. Side hole beneath the diaphragm. Elsewhere there are some dilated loops of small bowel in the left upper quadrant of the abdomen. Defibrillator. IMPRESSION: Limited x-ray for tube placement has tip overlying the fundus of the stomach Electronically Signed   By: Jill Side M.D.   On: 10/09/2022 17:34   CT ABDOMEN PELVIS WO CONTRAST  Result Date: 10/09/2022 CLINICAL DATA:  Acute abdominal pain. Patient reports pain and distension since last night. Nausea, vomiting, diarrhea. EXAM: CT ABDOMEN AND PELVIS WITHOUT CONTRAST TECHNIQUE: Multidetector CT imaging of the abdomen and pelvis was performed following the standard protocol without IV contrast. RADIATION DOSE REDUCTION: This exam was performed according to the departmental dose-optimization program which includes automated exposure control, adjustment of the mA and/or kV according to patient size and/or use of iterative reconstruction technique. COMPARISON:  None Available. FINDINGS: Lower chest: Pacemaker wires partially visualized, mild cardiomegaly. Trace right pleural effusion. Hepatobiliary: Subcentimeter subcapsular low-density in the posterior right hepatic lobe is too small to accurately characterize but likely small cyst. No evidence of solid liver lesion on this unenhanced exam. Gallbladder physiologically distended, no calcified stone. No biliary dilatation. Pancreas: No ductal dilatation or inflammation. Spleen: Normal in size without focal abnormality. Adrenals/Urinary Tract: No adrenal nodule. There is slight adrenal thickening on the left. Bilateral renal  parenchymal thinning. No hydronephrosis or renal calculi. No evidence of solid renal lesion. Both ureters are decompressed.  The urinary bladder is partially distended, grossly normal for degree of distension. Stomach/Bowel: Stomach is partially distended with fluid. Small bowel is dilated and fluid-filled. There is fecalization of small bowel contents within small bowel loop in the right abdomen. Transition from dilated to nondilated small bowel just distal to the fecalized segment where there is short segment of wall thickening. Transition on series 2, image 55 and series 5, image 34. There is trace mesenteric edema and free fluid but no small bowel pneumatosis, free air or perforation. Small bowel distal to the transition point is decompressed. Suspected diverticulosis of the appendix but no appendicitis or diverticulitis. Additional diverticular seen about the cecum. Majority of the colon is nondistended. No colonic inflammatory change. Vascular/Lymphatic: Aortic atherosclerosis without aneurysm. No portal venous or mesenteric gas. No bulky abdominopelvic adenopathy. Reproductive: Mild prostate enlargement with mass effect on the bladder base. Other: Mesenteric edema with small amount of mesenteric free fluid in the small bowel mesentery. No perforation, free air or focal fluid collection. There is fat within both inguinal canals. Musculoskeletal: There are 4 non-rib-bearing lumbar vertebra. Moderate lower lumbar facet hypertrophy. Bilateral hip osteoarthritis. There are no acute or suspicious osseous abnormalities. IMPRESSION: 1. Small bowel obstruction with transition point in the right abdomen. There is short segment of wall thickening of small bowel at the transition point. This may be due to adhesions if there is history of prior abdominal surgery inflammation, however the possibility of small bowel stricture, neoplasm or focal enteritis is also considered. 2. Minimal ascending colonic diverticulosis. There is also a diverticular changes of the appendix. No acute diverticulitis or appendicitis. 3. Mild prostate enlargement with  mass effect on the bladder base. 4. Trace right pleural effusion. Aortic Atherosclerosis (ICD10-I70.0). Electronically Signed   By: Keith Rake M.D.   On: 10/09/2022 15:43    Anti-infectives: Anti-infectives (From admission, onward)    None       Assessment/Plan: s/p Procedure(s): COLON RESECTION RIGHT (Right) Sbo seems to be improving Will repeat small bowel protocol today since no contrast was seen after the first time ambulate  LOS: 2 days    Jacob Guerrero 10/11/2022

## 2022-10-12 ENCOUNTER — Inpatient Hospital Stay (HOSPITAL_COMMUNITY): Payer: Medicare Other

## 2022-10-12 DIAGNOSIS — K56609 Unspecified intestinal obstruction, unspecified as to partial versus complete obstruction: Secondary | ICD-10-CM | POA: Diagnosis not present

## 2022-10-12 LAB — GLUCOSE, CAPILLARY
Glucose-Capillary: 138 mg/dL — ABNORMAL HIGH (ref 70–99)
Glucose-Capillary: 156 mg/dL — ABNORMAL HIGH (ref 70–99)
Glucose-Capillary: 165 mg/dL — ABNORMAL HIGH (ref 70–99)
Glucose-Capillary: 170 mg/dL — ABNORMAL HIGH (ref 70–99)
Glucose-Capillary: 179 mg/dL — ABNORMAL HIGH (ref 70–99)
Glucose-Capillary: 180 mg/dL — ABNORMAL HIGH (ref 70–99)

## 2022-10-12 NOTE — Progress Notes (Signed)
Pt tolerated CLD fair. Does c/o of mild abdominal discomfort post consumption of CLD. Will monitor.

## 2022-10-12 NOTE — Progress Notes (Signed)
Patient ID: Jacob Guerrero, male   DOB: [birth year], 77 y.o.   MRN: [id number] [facility] Surgery Progress Note     Subjective: CC-  Abdomen a little sore but pain significantly improved. Denies any n/v. Passing flatus and had several loose stools yesterday. Asking when he can go home.  Objective: Vital signs in last 24 hours: Temp:  [98.3 F (36.8 C)-98.6 F (37 C)] 98.6 F (37 C) (02/26 0356) Pulse Rate:  [67-81] 81 (02/26 0356) Resp:  [14-18] 16 (02/26 0356) BP: (119-151)/(64-75) 119/64 (02/26 0356) SpO2:  [95 %-99 %] 96 % (02/26 0813) Last BM Date : 10/12/22  Intake/Output from previous day: 02/25 0701 - 02/26 0700 In: 1750.4 [I.V.:1660.4; NG/GT:90] Out: 650 [Urine:650] Intake/Output this shift: No intake/output data recorded.  PE: Gen:  Alert, NAD, pleasant Abd: soft, ND, NT  Lab Results:  Recent Labs    10/10/22 0534 10/11/22 0817  WBC 7.8 9.0  HGB 11.3* 10.5*  HCT 34.3* 33.0*  PLT 198 169   BMET Recent Labs    10/10/22 0534 10/11/22 0817  NA 135 139  K 4.5 4.4  CL 103 104  CO2 21* 23  GLUCOSE 211* 178*  BUN 63* 58*  CREATININE 3.20* 2.93*  CALCIUM 9.3 9.2   PT/INR No results for input(s): "LABPROT", "INR" in the last 72 hours. CMP     Component Value Date/Time   NA 139 10/11/2022 0817   K 4.4 10/11/2022 0817   CL 104 10/11/2022 0817   CO2 23 10/11/2022 0817   GLUCOSE 178 (H) 10/11/2022 0817   BUN 58 (H) 10/11/2022 0817   CREATININE 2.93 (H) 10/11/2022 0817   CALCIUM 9.2 10/11/2022 0817   PROT 6.6 10/11/2022 0817   ALBUMIN 3.5 10/11/2022 0817   AST 15 10/11/2022 0817   ALT 11 10/11/2022 0817   ALKPHOS 43 10/11/2022 0817   BILITOT 0.6 10/11/2022 0817   GFRNONAA 21 (L) 10/11/2022 0817   Lipase     Component Value Date/Time   LIPASE 50 10/09/2022 1303       Studies/Results: DG Abd 1 View  Result Date: 10/12/2022 CLINICAL DATA:  [id number] Abdominal pain [id number] EXAM: ABDOMEN - 1 VIEW COMPARISON:  KUB, 10/11/2022.  CT AP,  10/09/2022. FINDINGS: Support lines: AICD lead, incompletely imaged. Similar appearance and degree of distention of dilated central bowel loop. Nonobstructed colon. No intraperitoneal air. No interval osseous abnormality. IMPRESSION: Similar abdominal findings with dilated central bowel loop Electronically Signed   By: Michaelle Birks M.D.   On: 10/12/2022 08:05   DG Abd Portable 1V-Small Bowel Obstruction Protocol-initial, 8 hr delay  Result Date: 10/11/2022 CLINICAL DATA:  Small bowel protocol, 8 hour delay. EXAM: PORTABLE ABDOMEN - 1 VIEW COMPARISON:  Radiographs earlier today. FINDINGS: Administered enteric contrast is seen throughout the colon. There is persisting gaseous distention of small bowel centrally. No evidence of free air. IMPRESSION: 1. Administered enteric contrast throughout the colon. 2. Persistent gaseous distention of small bowel centrally. Electronically Signed   By: Keith Rake M.D.   On: 10/11/2022 23:05   DG Abd Portable 1V  Result Date: 10/11/2022 CLINICAL DATA:  Small bowel obstruction. EXAM: PORTABLE ABDOMEN - 1 VIEW COMPARISON:  Radiograph yesterday FINDINGS: Removal of enteric tube. Persisting gaseous distention of small bowel in the left abdomen, although slightly improved from yesterday's exam. No obvious free air on the supine views. No other interval change from prior. IMPRESSION: Persisting gaseous distention of small bowel in the left abdomen, although slightly improved from yesterday's  exam. Removal of enteric tube. Electronically Signed   By: Keith Rake M.D.   On: 10/11/2022 12:19    Anti-infectives: Anti-infectives (From admission, onward)    None        Assessment/Plan SBO vs gastroenteritis with obstructive component - passed SBO protocol, contrast in colon. Symptoms improved and having bowel function. - ok for clear liquids and advance diet as tolerated. If tolerating liquids and continues to have bowel function ok for discharge later today from  surgical standpoint - this was discussed with the primary team.  ID - none FEN - CLD ADAT VTE - sq heparin Foley - none   I reviewed last 24 h vitals and pain scores, last 48 h intake and output, last 24 h labs and trends, and last 24 h imaging results.    LOS: 3 days    [facility] 10/12/2022, 10:45 AM Please see Amion for pager number during day hours 7:00am-4:30pm

## 2022-10-12 NOTE — Progress Notes (Signed)
Nurse tech stated that the pt said that his NG tube was making a noise that he hadn't heard before. Nurse entered the pt's room and noted that the NG tube had come out of nose. Shared with the pt that the a new NG tube would need to be replaced. He stated "no". He then proceeded to ask if he could have some water. Nurse informed him of the purpose of the NG tube and that drinking water could cause him to be nauseous and vomit. Nurse told him that he couldn't have water, at this time. Pt stated, "okay".

## 2022-10-12 NOTE — Progress Notes (Signed)
PROGRESS NOTE    Jacob Guerrero  [id number] DOB: 12-20-1945 DOA: 10/09/2022 PCP: Merryl Hacker, No   Brief Narrative: HPI per Dr. Daryll Drown on 10/09/2022   Jacob Guerrero is a 77 y.o. male with medical history significant of CKD, DM2, HTN, Gout, Chronic bronchitis, GERD, glaucoma, RBBB, CAD with CABG and pacemaker, BPH, anemia, OA, HLD who presents for abdominal pain and found to have an SBO.  He was travelling yesterday with his girlfriend from [place] to visit his brother who lives around this area.  Last night, after travelling, he developed abdominal pain which was sharp, above the belly button and relatively constant.  He had emesis (non bloody) and bowel movements which would briefly improve the pain, but otherwise it was constant.  He noticed no blood loss.  Nothing else made the pain better.  He notes no preceding illness, no sick contacts.  He ate mostly fruit on the drive down here and did not feel that it was very out of the norm for him.  Further symptoms include dizziness when standing.  This will happen often, but not everytime he stands up.  He will feel dizzy and hot and feel like the room is darkening.  It will pass if he gives it time.     ED Course: In the ED, he was found to have a WBC of 14.4, H/H of 11 and 35, Na of 134, bicarb of 19, glucose of 289, BUN of 63, Cr of 3.45 (BUN/Cr ratio is 18).  CT abdomen showed an SBO with possible SB inflammation.  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  He had an NGT placed and surgery was consulted.  Surgery will do a gastrograffin follow through.   Assessment & Plan:   Principal Problem:   SBO (small bowel obstruction) (HCC) Active Problems:   BPH (benign prostatic hyperplasia)   CAD (coronary artery disease)   CKD (chronic kidney disease) stage 3, GFR 30-59 ml/min (HCC)   Diabetes mellitus ([place])   Essential hypertension   GERD (gastroesophageal reflux disease)   Gout   HLD (hyperlipidemia)  #1 small bowel  obstruction - patient admitted with nausea vomiting abdominal distention and abdominal pain.   CT abdomen shows-. Small bowel obstruction with transition point in the right abdomen. There is short segment of wall thickening of small bowel at the transition point. This may be due to adhesions if there is history of prior abdominal surgery inflammation, however the possibility of small bowel stricture, neoplasm or focal enteritis is also considered Minimal ascending colonic diverticulosis. There is also a diverticular changes of the appendix. No acute diverticulitis or appendicitis. Mild prostate enlargement with mass effect on the bladder base.Trace right pleural effusion KUB 2/25-Persisting gaseous distention of small bowel in the left abdomen, although slightly improved from yesterday's exam. Removal of enteric tube.NG tube in place patient has not had a BM or flatus.  Small bowel follow-through with very minimal contrast.  Surgery concerned if it was all sucked out with NG tube.   KUB 324 with persistent dilated bowel loops  LEUKOCYTOSIS resolved   #2 AKI on CKD stage IIIb with metabolic acidosis-improving decrease fluids to 75 cc an hour. Cr 2.9 from 3.45 on admit Check bladder scan  #3 BPH restart home meds   #4 type 2 diabetes restart home meds once able to take p.o. Continue SSI A1c 7.1 CBG (last 3)  Recent Labs    10/12/22 0351 10/12/22 0731 10/12/22 1237  GLUCAP 138*  156* 170*    #5 history of CAD on Coreg Entresto and Lasix which is all being on hold will restart once he is able to take p.o.  #6 history of essential hypertension continue IV metoprolol as needed restart home meds as able  #7 GERD on omeprazole  #8 history of gout on allopurinol at home  #9 hyperlipidemia on statins   Estimated body mass index is 24.83 kg/m as calculated from the following:   Height as of this encounter: '6\' 4"'$  (1.93 m).   Weight as of this encounter: 92.5 kg.  DVT prophylaxis:  Heparin  Code Status: Full code  family Communication: None at bedside Disposition Plan:  Status is: Inpatient Remains inpatient appropriate because: Patient has small bowel obstruction with n.p.o. and IV fluids   Consultants:  General surgery  Procedures: NG tube Antimicrobials: None   Subjective:  NG tube came out last night White count 9.0 hemoglobin 10.5 having clear liquids had flatus and bowel movements after suppository  Objective: Vitals:   10/11/22 2017 10/12/22 0356 10/12/22 0813 10/12/22 1318  BP: 120/75 119/64  (!) 142/68  Pulse: 67 81  61  Resp: '18 16  18  '$ Temp: 98.5 F (36.9 C) 98.6 F (37 C)    TempSrc: Oral Oral    SpO2: 97% 97% 96% 96%  Weight:      Height:        Intake/Output Summary (Last 24 hours) at 10/12/2022 1431 Last data filed at 10/12/2022 1300 Gross per 24 hour  Intake 1558.19 ml  Output 900 ml  Net 658.19 ml    Filed Weights   10/09/22 1224  Weight: 92.5 kg    Examination: NG tube suctioning in place  General exam: Appears mild distress Respiratory system: Clear to auscultation. Respiratory effort normal. Cardiovascular system: S1 & S2 heard, RRR. No JVD, murmurs, rubs, gallops or clicks. No pedal edema. Gastrointestinal system: Abdomen is distended, soft and tender. No organomegaly or masses felt. Normal bowel sounds heard. Central nervous system: Alert and oriented. No focal neurological deficits. Extremities: Symmetric 5 x 5 power. Skin: No rashes, lesions or ulcers Psychiatry: Judgement and insight appear normal. Mood & affect appropriate.     Data Reviewed: I have personally reviewed following labs and imaging studies  CBC: Recent Labs  Lab 10/09/22 1303 10/09/22 1814 10/10/22 0534 10/11/22 0817  WBC 15.6* 14.4* 7.8 9.0  NEUTROABS 14.1*  --   --   --   HGB 12.3* 11.9* 11.3* 10.5*  HCT 37.3* 35.9* 34.3* 33.0*  MCV 91.9 92.5 90.3 92.4  PLT 241 230 198 [id number]    Basic Metabolic Panel: Recent Labs  Lab 10/09/22 1303  10/09/22 1814 10/10/22 0534 10/11/22 0817  NA 134*  --  135 139  K 5.1  --  4.5 4.4  CL 102  --  103 104  CO2 19*  --  21* 23  GLUCOSE 289*  --  211* 178*  BUN 63*  --  63* 58*  CREATININE 3.45* 3.36* 3.20* 2.93*  CALCIUM 10.1  --  9.3 9.2    GFR: Estimated Creatinine Clearance: 25.9 mL/min (A) (by C-G formula based on SCr of 2.93 mg/dL (H)). Liver Function Tests: Recent Labs  Lab 10/09/22 1303 10/11/22 0817  AST 24 15  ALT 12 11  ALKPHOS 59 43  BILITOT 0.6 0.6  PROT 9.0* 6.6  ALBUMIN 4.6 3.5    Recent Labs  Lab 10/09/22 1303  LIPASE 50    No results for input(s): "  AMMONIA" in the last 168 hours. Coagulation Profile: No results for input(s): "INR", "PROTIME" in the last 168 hours. Cardiac Enzymes: No results for input(s): "CKTOTAL", "CKMB", "CKMBINDEX", "TROPONINI" in the last 168 hours. BNP (last 3 results) No results for input(s): "PROBNP" in the last 8760 hours. HbA1C: Recent Labs    10/09/22 1814  HGBA1C 7.1*    CBG: Recent Labs  Lab 10/11/22 1911 10/11/22 2312 10/12/22 0351 10/12/22 0731 10/12/22 1237  GLUCAP 145* 139* 138* 156* 170*    Lipid Profile: No results for input(s): "CHOL", "HDL", "LDLCALC", "TRIG", "CHOLHDL", "LDLDIRECT" in the last 72 hours. Thyroid Function Tests: No results for input(s): "TSH", "T4TOTAL", "FREET4", "T3FREE", "THYROIDAB" in the last 72 hours. Anemia Panel: No results for input(s): "VITAMINB12", "FOLATE", "FERRITIN", "TIBC", "IRON", "RETICCTPCT" in the last 72 hours. Sepsis Labs: No results for input(s): "PROCALCITON", "LATICACIDVEN" in the last 168 hours.  Recent Results (from the past 240 hour(s))  Resp panel by RT-PCR (RSV, Flu A&B, Covid) Anterior Nasal Swab     Status: None   Collection Time: 10/09/22  1:14 PM   Specimen: Anterior Nasal Swab  Result Value Ref Range Status   SARS Coronavirus 2 by RT PCR NEGATIVE NEGATIVE Final    Comment: (NOTE) SARS-CoV-2 target nucleic acids are NOT DETECTED.  The  SARS-CoV-2 RNA is generally detectable in upper respiratory specimens during the acute phase of infection. The lowest concentration of SARS-CoV-2 viral copies this assay can detect is 138 copies/mL. A negative result does not preclude SARS-Cov-2 infection and should not be used as the sole basis for treatment or other patient management decisions. A negative result may occur with  improper specimen collection/handling, submission of specimen other than nasopharyngeal swab, presence of viral mutation(s) within the areas targeted by this assay, and inadequate number of viral copies(<138 copies/mL). A negative result must be combined with clinical observations, patient history, and epidemiological information. The expected result is Negative.  Fact Sheet for Patients:  EntrepreneurPulse.com.au  Fact Sheet for Healthcare Providers:  IncredibleEmployment.be  This test is no t yet approved or cleared by the [place] FDA and  has been authorized for detection and/or diagnosis of SARS-CoV-2 by FDA under an Emergency Use Authorization (EUA). This EUA will remain  in effect (meaning this test can be used) for the duration of the COVID-19 declaration under Section 564(b)(1) of the Act, 21 U.S.C.section 360bbb-3(b)(1), unless the authorization is terminated  or revoked sooner.       Influenza A by PCR NEGATIVE NEGATIVE Final   Influenza B by PCR NEGATIVE NEGATIVE Final    Comment: (NOTE) The Xpert Xpress SARS-CoV-2/FLU/RSV plus assay is intended as an aid in the diagnosis of influenza from Nasopharyngeal swab specimens and should not be used as a sole basis for treatment. Nasal washings and aspirates are unacceptable for Xpert Xpress SARS-CoV-2/FLU/RSV testing.  Fact Sheet for Patients: EntrepreneurPulse.com.au  Fact Sheet for Healthcare Providers: IncredibleEmployment.be  This test is not yet approved or  cleared by the [place] FDA and has been authorized for detection and/or diagnosis of SARS-CoV-2 by FDA under an Emergency Use Authorization (EUA). This EUA will remain in effect (meaning this test can be used) for the duration of the COVID-19 declaration under Section 564(b)(1) of the Act, 21 U.S.C. section 360bbb-3(b)(1), unless the authorization is terminated or revoked.     Resp Syncytial Virus by PCR NEGATIVE NEGATIVE Final    Comment: (NOTE) Fact Sheet for Patients: EntrepreneurPulse.com.au  Fact Sheet for Healthcare Providers: IncredibleEmployment.be  This test  is not yet approved or cleared by the [place] and has been authorized for detection and/or diagnosis of SARS-CoV-2 by FDA under an Emergency Use Authorization (EUA). This EUA will remain in effect (meaning this test can be used) for the duration of the COVID-19 declaration under Section 564(b)(1) of the Act, 21 U.S.C. section 360bbb-3(b)(1), unless the authorization is terminated or revoked.  Performed at [facility], [place] [address] [place], [place] [address]          Radiology Studies: DG Abd 1 View  Result Date: 10/12/2022 CLINICAL DATA:  [id number] Abdominal pain [id number] EXAM: ABDOMEN - 1 VIEW COMPARISON:  KUB, 10/11/2022.  CT AP, 10/09/2022. FINDINGS: Support lines: AICD lead, incompletely imaged. Similar appearance and degree of distention of dilated central bowel loop. Nonobstructed colon. No intraperitoneal air. No interval osseous abnormality. IMPRESSION: Similar abdominal findings with dilated central bowel loop Electronically Signed   By: Michaelle Birks M.D.   On: 10/12/2022 08:05   DG Abd Portable 1V-Small Bowel Obstruction Protocol-initial, 8 hr delay  Result Date: 10/11/2022 CLINICAL DATA:  Small bowel protocol, 8 hour delay. EXAM: PORTABLE ABDOMEN - 1 VIEW COMPARISON:  Radiographs earlier today. FINDINGS: Administered enteric contrast  is seen throughout the colon. There is persisting gaseous distention of small bowel centrally. No evidence of free air. IMPRESSION: 1. Administered enteric contrast throughout the colon. 2. Persistent gaseous distention of small bowel centrally. Electronically Signed   By: Keith Rake M.D.   On: 10/11/2022 23:05   DG Abd Portable 1V  Result Date: 10/11/2022 CLINICAL DATA:  Small bowel obstruction. EXAM: PORTABLE ABDOMEN - 1 VIEW COMPARISON:  Radiograph yesterday FINDINGS: Removal of enteric tube. Persisting gaseous distention of small bowel in the left abdomen, although slightly improved from yesterday's exam. No obvious free air on the supine views. No other interval change from prior. IMPRESSION: Persisting gaseous distention of small bowel in the left abdomen, although slightly improved from yesterday's exam. Removal of enteric tube. Electronically Signed   By: Keith Rake M.D.   On: 10/11/2022 12:19        Scheduled Meds:  bisacodyl  10 mg Rectal Once   budesonide  0.25 mg Nebulization BID   dorzolamide  1 drop Both Eyes TID   heparin injection (subcutaneous)  5,000 Units Subcutaneous Q8H   insulin aspart  0-15 Units Subcutaneous Q4H   Netarsudil-Latanoprost  1 drop Both Eyes QHS   sodium chloride flush  3 mL Intravenous Q12H   Continuous Infusions:  lactated ringers 125 mL/hr at 10/12/22 0607     LOS: 3 days    Time spent: 36 min  Georgette Shell, MD 10/12/2022, 2:31 PM

## 2022-10-13 ENCOUNTER — Inpatient Hospital Stay (HOSPITAL_COMMUNITY): Payer: Medicare Other

## 2022-10-13 DIAGNOSIS — K56609 Unspecified intestinal obstruction, unspecified as to partial versus complete obstruction: Secondary | ICD-10-CM | POA: Diagnosis not present

## 2022-10-13 LAB — COMPREHENSIVE METABOLIC PANEL
ALT: 8 U/L (ref 0–44)
AST: 11 U/L — ABNORMAL LOW (ref 15–41)
Albumin: 3.2 g/dL — ABNORMAL LOW (ref 3.5–5.0)
Alkaline Phosphatase: 44 U/L (ref 38–126)
Anion gap: 8 (ref 5–15)
BUN: 27 mg/dL — ABNORMAL HIGH (ref 8–23)
CO2: 23 mmol/L (ref 22–32)
Calcium: 8.9 mg/dL (ref 8.9–10.3)
Chloride: 110 mmol/L (ref 98–111)
Creatinine, Ser: 2.09 mg/dL — ABNORMAL HIGH (ref 0.61–1.24)
GFR, Estimated: 32 mL/min — ABNORMAL LOW (ref >60)
Glucose, Bld: 204 mg/dL — ABNORMAL HIGH (ref 70–99)
Potassium: 3.5 mmol/L (ref 3.5–5.1)
Sodium: 141 mmol/L (ref 135–145)
Total Bilirubin: 0.6 mg/dL (ref 0.3–1.2)
Total Protein: 6.5 g/dL (ref 6.5–8.1)

## 2022-10-13 LAB — GLUCOSE, CAPILLARY
Glucose-Capillary: 174 mg/dL — ABNORMAL HIGH (ref 70–99)
Glucose-Capillary: 178 mg/dL — ABNORMAL HIGH (ref 70–99)
Glucose-Capillary: 199 mg/dL — ABNORMAL HIGH (ref 70–99)
Glucose-Capillary: 181 mg/dL — ABNORMAL HIGH (ref 70–99)
Glucose-Capillary: 166 mg/dL — ABNORMAL HIGH (ref 70–99)

## 2022-10-13 LAB — BASIC METABOLIC PANEL
Anion gap: 9 (ref 5–15)
BUN: 32 mg/dL — ABNORMAL HIGH (ref 8–23)
CO2: 24 mmol/L (ref 22–32)
Calcium: 9.4 mg/dL (ref 8.9–10.3)
Chloride: 112 mmol/L — ABNORMAL HIGH (ref 98–111)
Creatinine, Ser: 2.24 mg/dL — ABNORMAL HIGH (ref 0.61–1.24)
GFR, Estimated: 29 mL/min — ABNORMAL LOW (ref >60)
Glucose, Bld: 182 mg/dL — ABNORMAL HIGH (ref 70–99)
Potassium: 4.4 mmol/L (ref 3.5–5.1)
Sodium: 145 mmol/L (ref 135–145)

## 2022-10-13 LAB — CBC
HCT: 33 % — ABNORMAL LOW (ref 39.0–52.0)
Hemoglobin: 10.5 g/dL — ABNORMAL LOW (ref 13.0–17.0)
MCH: 29.7 pg (ref 26.0–34.0)
MCHC: 31.8 g/dL (ref 30.0–36.0)
MCV: 93.5 fL (ref 80.0–100.0)
Platelets: 169 10*3/uL (ref 150–400)
RBC: 3.53 MIL/uL — ABNORMAL LOW (ref 4.22–5.81)
RDW: 14.1 % (ref 11.5–15.5)
WBC: 9.7 10*3/uL (ref 4.0–10.5)
nRBC: 0 % (ref 0.0–0.2)

## 2022-10-13 NOTE — Progress Notes (Signed)
Patient ID: Jacob Guerrero, male   DOB: 11-16-1945, 77 y.o.   MRN: [id number] [name]     Subjective: CC-  Reports pain is better, but not totally resolved - mild intermittent cramping. Reports decreased frequency of BMs. Denies nausea or vomiting.   Objective: Vital signs in last 24 hours: Temp:  [98.1 F (36.7 C)-99 F (37.2 C)] 98.1 F (36.7 C) (02/27 1106) Pulse Rate:  [61-85] 72 (02/27 1106) Resp:  [18] 18 (02/27 1106) BP: (134-155)/(61-73) 134/70 (02/27 1106) SpO2:  [94 %-97 %] 96 % (02/27 1106) Last BM Date : 10/12/22  Intake/Output from previous day: 02/26 0701 - 02/27 0700 In: 3239.1 [P.O.:1560; I.V.:1679.1] Out: 1750 [Urine:1750] Intake/Output this shift: Total I/O In: -  Out: 200 [Urine:200]  PE: Gen:  Alert, NAD, pleasant Abd: soft, ND, NT  Lab Results:  Recent Labs    10/11/22 0817 10/13/22 0502  WBC 9.0 9.7  HGB 10.5* 10.5*  HCT 33.0* 33.0*  PLT 169 169   BMET Recent Labs    10/11/22 0817 10/13/22 0502  NA 139 145  K 4.4 4.4  CL 104 112*  CO2 23 24  GLUCOSE 178* 182*  BUN 58* 32*  CREATININE 2.93* 2.24*  CALCIUM 9.2 9.4   PT/INR No results for input(s): "LABPROT", "INR" in the last 72 hours. CMP     Component Value Date/Time   NA 145 10/13/2022 0502   K 4.4 10/13/2022 0502   CL 112 (H) 10/13/2022 0502   CO2 24 10/13/2022 0502   GLUCOSE 182 (H) 10/13/2022 0502   BUN 32 (H) 10/13/2022 0502   CREATININE 2.24 (H) 10/13/2022 0502   CALCIUM 9.4 10/13/2022 0502   PROT 6.6 10/11/2022 0817   ALBUMIN 3.5 10/11/2022 0817   AST 15 10/11/2022 0817   ALT 11 10/11/2022 0817   ALKPHOS 43 10/11/2022 0817   BILITOT 0.6 10/11/2022 0817   GFRNONAA 29 (L) 10/13/2022 0502   Lipase     Component Value Date/Time   LIPASE 50 10/09/2022 1303       Studies/Results: DG Abd 1 View  Result Date: 10/13/2022 CLINICAL DATA:  Abdominal pain EXAM: ABDOMEN - 1 VIEW COMPARISON:  Yesterday FINDINGS: 2 portable  radiographs, presumably both supine. Pacer/ICD is incompletely imaged. Increased number of dilated small bowel loops. Example loop at 3.6 cm, similar. Normal caliber colon. Low pelvis excluded. No gross free intraperitoneal air. IMPRESSION: Increased number of moderately dilated small bowel loops. Favor progressive small-bowel obstruction. Electronically Signed   By: Abigail Miyamoto M.D.   On: 10/13/2022 08:23   DG Abd 1 View  Result Date: 10/12/2022 CLINICAL DATA:  [id number] Abdominal pain [id number] EXAM: ABDOMEN - 1 VIEW COMPARISON:  KUB, 10/11/2022.  CT AP, 10/09/2022. FINDINGS: Support lines: AICD lead, incompletely imaged. Similar appearance and degree of distention of dilated central bowel loop. Nonobstructed colon. No intraperitoneal air. No interval osseous abnormality. IMPRESSION: Similar abdominal findings with dilated central bowel loop Electronically Signed   By: Michaelle Birks M.D.   On: 10/12/2022 08:05   DG Abd Portable 1V-Small Bowel Obstruction Protocol-initial, 8 hr delay  Result Date: 10/11/2022 CLINICAL DATA:  Small bowel protocol, 8 hour delay. EXAM: PORTABLE ABDOMEN - 1 VIEW COMPARISON:  Radiographs earlier today. FINDINGS: Administered enteric contrast is seen throughout the colon. There is persisting gaseous distention of small bowel centrally. No evidence of free air. IMPRESSION: 1. Administered enteric contrast throughout the colon. 2. Persistent gaseous distention of small bowel centrally. Electronically Signed  By: Keith Rake M.D.   On: 10/11/2022 23:05    Anti-infectives: Anti-infectives (From admission, onward)    None        Assessment/Plan SBO vs gastroenteritis with obstructive component - passed SBO protocol, contrast in colon. Symptoms improved and having bowel function. - ok for SOFT diet. ok for discharge later today from surgical standpoint. No signs of obstruction, abd non-tender  ID - none FEN - SOFT  VTE - sq heparin Foley - none   I reviewed last 24  h vitals and pain scores, last 48 h intake and output, last 24 h labs and trends, and last 24 h imaging results.    LOS: 4 days    [facility] 10/13/2022, 11:09 AM Please see Amion for pager number during day hours 7:00am-4:30pm

## 2022-10-13 NOTE — Progress Notes (Signed)
PROGRESS NOTE    Jacob Guerrero  [id number] DOB: 08-Apr-1946 DOA: 10/09/2022 PCP: Merryl Hacker, No   Brief Narrative:   Jacob Guerrero is a 77 y.o. male with medical history significant of CKD, DM2, HTN, Gout, Chronic bronchitis, GERD, glaucoma, RBBB, CAD with CABG and pacemaker, BPH, anemia, OA, HLD who presents for abdominal pain and found to have an SBO.  Patient was admitted to the floor started NG tube IV fluids and n.p.o. status.  NG tube came out 2 days ago overnight.  He is now not able to tolerate a diet due to abdominal pain.  He is nauseous surgery has been consulted.   Assessment & Plan:   Principal Problem:   SBO (small bowel obstruction) (HCC) Active Problems:   BPH (benign prostatic hyperplasia)   CAD (coronary artery disease)   CKD (chronic kidney disease) stage 3, GFR 30-59 ml/min (HCC)   Diabetes mellitus ([place])   Essential hypertension   GERD (gastroesophageal reflux disease)   Gout   HLD (hyperlipidemia)  #1 small bowel obstruction - patient admitted with nausea vomiting abdominal distention and abdominal pain.   CT abdomen shows-. Small bowel obstruction with transition point in the right abdomen. There is short segment of wall thickening of small bowel at the transition point. KUB 10/13/2022 with increased number of moderately dilated small bowel loops favor progressive small bowel obstruction.  I decreased a clear liquid diet.  He is not able to tolerate a soft diet.  He had some gas and bowel movements after suppository. out of bed encourage activity discussed with the patient   #2 AKI on CKD stage IIIb with metabolic acidosis-improving decrease fluids to 75 cc an hour. Cr 2.24 from 3.45 on admit  #3 BPH restart home meds   #4 type 2 diabetes restart home meds once able to take p.o. Continue SSI A1c 7.1 CBG (last 3)  Recent Labs    10/13/22 0402 10/13/22 0723 10/13/22 1141  GLUCAP 174* 178* 199*    #5 history of CAD on Coreg Entresto and  Lasix which is all being on hold will restart once he is able to take p.o.  #6 history of essential hypertension continue IV metoprolol as needed restart home meds as able  #7 GERD on omeprazole  #8 history of gout on allopurinol at home  #9 hyperlipidemia on statins   Estimated body mass index is 24.83 kg/m as calculated from the following:   Height as of this encounter: '6\' 4"'$  (1.93 m).   Weight as of this encounter: 92.5 kg.  DVT prophylaxis: Heparin  Code Status: Full code  family Communication: None at bedside Disposition Plan:  Status is: Inpatient Remains inpatient appropriate because: Patient has small bowel obstruction with n.p.o. and IV fluids   Consultants:  General surgery  Procedures: NG tube Antimicrobials: None   Subjective:   When I saw him he was feeling better plan was to go home later around 2 PM he started to complain of increasing abdominal pain KUB done today shows worsening bowel obstruction.  He reports he has not had anything to eat today. Objective: Vitals:   10/12/22 2004 10/13/22 0541 10/13/22 0839 10/13/22 1106  BP: (!) 155/73 (!) 140/61  134/70  Pulse: 74 85  72  Resp: '18 18  18  '$ Temp: 99 F (37.2 C) 98.9 F (37.2 C)  98.1 F (36.7 C)  TempSrc: Oral Oral  Oral  SpO2: 95% 94% 97% 96%  Weight:      Height:  Intake/Output Summary (Last 24 hours) at 10/13/2022 1359 Last data filed at 10/13/2022 1142 Gross per 24 hour  Intake 2879.13 ml  Output 1550 ml  Net 1329.13 ml    Filed Weights   10/09/22 1224  Weight: 92.5 kg    Examination:  General exam: Appears mild distress Respiratory system: Clear to auscultation. Respiratory effort normal. Cardiovascular system: S1 & S2 heard, RRR. No JVD, murmurs, rubs, gallops or clicks. No pedal edema. Gastrointestinal system: Abdomen is distended, soft and tender. No organomegaly or masses felt. Normal bowel sounds heard. Central nervous system: Alert and oriented. No focal neurological  deficits. Extremities: Symmetric 5 x 5 power. Skin: No rashes, lesions or ulcers Psychiatry: Judgement and insight appear normal. Mood & affect appropriate.     Data Reviewed: I have personally reviewed following labs and imaging studies  CBC: Recent Labs  Lab 10/09/22 1303 10/09/22 1814 10/10/22 0534 10/11/22 0817 10/13/22 0502  WBC 15.6* 14.4* 7.8 9.0 9.7  NEUTROABS 14.1*  --   --   --   --   HGB 12.3* 11.9* 11.3* 10.5* 10.5*  HCT 37.3* 35.9* 34.3* 33.0* 33.0*  MCV 91.9 92.5 90.3 92.4 93.5  PLT 241 230 198 169 [id number]    Basic Metabolic Panel: Recent Labs  Lab 10/09/22 1303 10/09/22 1814 10/10/22 0534 10/11/22 0817 10/13/22 0502  NA 134*  --  135 139 145  K 5.1  --  4.5 4.4 4.4  CL 102  --  103 104 112*  CO2 19*  --  21* 23 24  GLUCOSE 289*  --  211* 178* 182*  BUN 63*  --  63* 58* 32*  CREATININE 3.45* 3.36* 3.20* 2.93* 2.24*  CALCIUM 10.1  --  9.3 9.2 9.4    GFR: Estimated Creatinine Clearance: 33.9 mL/min (A) (by C-G formula based on SCr of 2.24 mg/dL (H)). Liver Function Tests: Recent Labs  Lab 10/09/22 1303 10/11/22 0817  AST 24 15  ALT 12 11  ALKPHOS 59 43  BILITOT 0.6 0.6  PROT 9.0* 6.6  ALBUMIN 4.6 3.5    Recent Labs  Lab 10/09/22 1303  LIPASE 50    No results for input(s): "AMMONIA" in the last 168 hours. Coagulation Profile: No results for input(s): "INR", "PROTIME" in the last 168 hours. Cardiac Enzymes: No results for input(s): "CKTOTAL", "CKMB", "CKMBINDEX", "TROPONINI" in the last 168 hours. BNP (last 3 results) No results for input(s): "PROBNP" in the last 8760 hours. HbA1C: No results for input(s): "HGBA1C" in the last 72 hours.  CBG: Recent Labs  Lab 10/12/22 2000 10/12/22 2348 10/13/22 0402 10/13/22 0723 10/13/22 1141  GLUCAP 165* 179* 174* 178* 199*    Lipid Profile: No results for input(s): "CHOL", "HDL", "LDLCALC", "TRIG", "CHOLHDL", "LDLDIRECT" in the last 72 hours. Thyroid Function Tests: No results for  input(s): "TSH", "T4TOTAL", "FREET4", "T3FREE", "THYROIDAB" in the last 72 hours. Anemia Panel: No results for input(s): "VITAMINB12", "FOLATE", "FERRITIN", "TIBC", "IRON", "RETICCTPCT" in the last 72 hours. Sepsis Labs: No results for input(s): "PROCALCITON", "LATICACIDVEN" in the last 168 hours.  Recent Results (from the past 240 hour(s))  Resp panel by RT-PCR (RSV, Flu A&B, Covid) Anterior Nasal Swab     Status: None   Collection Time: 10/09/22  1:14 PM   Specimen: Anterior Nasal Swab  Result Value Ref Range Status   SARS Coronavirus 2 by RT PCR NEGATIVE NEGATIVE Final    Comment: (NOTE) SARS-CoV-2 target nucleic acids are NOT DETECTED.  The SARS-CoV-2 RNA is generally detectable in  upper respiratory specimens during the acute phase of infection. The lowest concentration of SARS-CoV-2 viral copies this assay can detect is 138 copies/mL. A negative result does not preclude SARS-Cov-2 infection and should not be used as the sole basis for treatment or other patient management decisions. A negative result may occur with  improper specimen collection/handling, submission of specimen other than nasopharyngeal swab, presence of viral mutation(s) within the areas targeted by this assay, and inadequate number of viral copies(<138 copies/mL). A negative result must be combined with clinical observations, patient history, and epidemiological information. The expected result is Negative.  Fact Sheet for Patients:  EntrepreneurPulse.com.au  Fact Sheet for Healthcare Providers:  IncredibleEmployment.be  This test is no t yet approved or cleared by the [place] FDA and  has been authorized for detection and/or diagnosis of SARS-CoV-2 by FDA under an Emergency Use Authorization (EUA). This EUA will remain  in effect (meaning this test can be used) for the duration of the COVID-19 declaration under Section 564(b)(1) of the Act, 21 U.S.C.section  360bbb-3(b)(1), unless the authorization is terminated  or revoked sooner.       Influenza A by PCR NEGATIVE NEGATIVE Final   Influenza B by PCR NEGATIVE NEGATIVE Final    Comment: (NOTE) The Xpert Xpress SARS-CoV-2/FLU/RSV plus assay is intended as an aid in the diagnosis of influenza from Nasopharyngeal swab specimens and should not be used as a sole basis for treatment. Nasal washings and aspirates are unacceptable for Xpert Xpress SARS-CoV-2/FLU/RSV testing.  Fact Sheet for Patients: EntrepreneurPulse.com.au  Fact Sheet for Healthcare Providers: IncredibleEmployment.be  This test is not yet approved or cleared by the [place] FDA and has been authorized for detection and/or diagnosis of SARS-CoV-2 by FDA under an Emergency Use Authorization (EUA). This EUA will remain in effect (meaning this test can be used) for the duration of the COVID-19 declaration under Section 564(b)(1) of the Act, 21 U.S.C. section 360bbb-3(b)(1), unless the authorization is terminated or revoked.     Resp Syncytial Virus by PCR NEGATIVE NEGATIVE Final    Comment: (NOTE) Fact Sheet for Patients: EntrepreneurPulse.com.au  Fact Sheet for Healthcare Providers: IncredibleEmployment.be  This test is not yet approved or cleared by the [place] FDA and has been authorized for detection and/or diagnosis of SARS-CoV-2 by FDA under an Emergency Use Authorization (EUA). This EUA will remain in effect (meaning this test can be used) for the duration of the COVID-19 declaration under Section 564(b)(1) of the Act, 21 U.S.C. section 360bbb-3(b)(1), unless the authorization is terminated or revoked.  Performed at [facility], [place] [address] [place], [place] [address]          Radiology Studies: DG Abd 1 View  Result Date: 10/13/2022 CLINICAL DATA:  Abdominal pain EXAM: ABDOMEN - 1 VIEW COMPARISON:   Yesterday FINDINGS: 2 portable radiographs, presumably both supine. Pacer/ICD is incompletely imaged. Increased number of dilated small bowel loops. Example loop at 3.6 cm, similar. Normal caliber colon. Low pelvis excluded. No gross free intraperitoneal air. IMPRESSION: Increased number of moderately dilated small bowel loops. Favor progressive small-bowel obstruction. Electronically Signed   By: Abigail Miyamoto M.D.   On: 10/13/2022 08:23   DG Abd 1 View  Result Date: 10/12/2022 CLINICAL DATA:  [id number] Abdominal pain [id number] EXAM: ABDOMEN - 1 VIEW COMPARISON:  KUB, 10/11/2022.  CT AP, 10/09/2022. FINDINGS: Support lines: AICD lead, incompletely imaged. Similar appearance and degree of distention of dilated central bowel loop. Nonobstructed colon. No intraperitoneal air. No interval  osseous abnormality. IMPRESSION: Similar abdominal findings with dilated central bowel loop Electronically Signed   By: Michaelle Birks M.D.   On: 10/12/2022 08:05   DG Abd Portable 1V-Small Bowel Obstruction Protocol-initial, 8 hr delay  Result Date: 10/11/2022 CLINICAL DATA:  Small bowel protocol, 8 hour delay. EXAM: PORTABLE ABDOMEN - 1 VIEW COMPARISON:  Radiographs earlier today. FINDINGS: Administered enteric contrast is seen throughout the colon. There is persisting gaseous distention of small bowel centrally. No evidence of free air. IMPRESSION: 1. Administered enteric contrast throughout the colon. 2. Persistent gaseous distention of small bowel centrally. Electronically Signed   By: Keith Rake M.D.   On: 10/11/2022 23:05        Scheduled Meds:  bisacodyl  10 mg Rectal Once   budesonide  0.25 mg Nebulization BID   dorzolamide  1 drop Both Eyes TID   heparin injection (subcutaneous)  5,000 Units Subcutaneous Q8H   insulin aspart  0-15 Units Subcutaneous Q4H   Netarsudil-Latanoprost  1 drop Both Eyes QHS   sodium chloride flush  3 mL Intravenous Q12H   Continuous Infusions:  lactated ringers 125 mL/hr at  10/13/22 0534     LOS: 4 days    Time spent: 57 min  Georgette Shell, MD 10/13/2022, 1:59 PM

## 2022-10-14 ENCOUNTER — Encounter (HOSPITAL_COMMUNITY): Payer: Self-pay | Admitting: Internal Medicine

## 2022-10-14 ENCOUNTER — Other Ambulatory Visit: Payer: Self-pay

## 2022-10-14 ENCOUNTER — Encounter (HOSPITAL_COMMUNITY)
Admission: EM | Disposition: A | Discharge status: Home / Self Care | Payer: Self-pay | Source: Home / Self Care | Attending: Internal Medicine

## 2022-10-14 ENCOUNTER — Inpatient Hospital Stay: Admit: 2022-10-14 | Payer: Medicare Other | Admitting: Surgery

## 2022-10-14 ENCOUNTER — Inpatient Hospital Stay (HOSPITAL_COMMUNITY): Payer: Medicare Other | Admitting: Certified Registered Nurse Anesthetist

## 2022-10-14 ENCOUNTER — Inpatient Hospital Stay (HOSPITAL_COMMUNITY): Payer: Medicare Other

## 2022-10-14 DIAGNOSIS — I110 Hypertensive heart disease with heart failure: Secondary | ICD-10-CM

## 2022-10-14 DIAGNOSIS — I509 Heart failure, unspecified: Secondary | ICD-10-CM

## 2022-10-14 DIAGNOSIS — I10 Essential (primary) hypertension: Secondary | ICD-10-CM

## 2022-10-14 DIAGNOSIS — K6389 Other specified diseases of intestine: Secondary | ICD-10-CM

## 2022-10-14 DIAGNOSIS — N400 Enlarged prostate without lower urinary tract symptoms: Secondary | ICD-10-CM | POA: Diagnosis not present

## 2022-10-14 DIAGNOSIS — I2510 Atherosclerotic heart disease of native coronary artery without angina pectoris: Secondary | ICD-10-CM

## 2022-10-14 DIAGNOSIS — K219 Gastro-esophageal reflux disease without esophagitis: Secondary | ICD-10-CM

## 2022-10-14 DIAGNOSIS — I2585 Chronic coronary microvascular dysfunction: Secondary | ICD-10-CM

## 2022-10-14 DIAGNOSIS — E119 Type 2 diabetes mellitus without complications: Secondary | ICD-10-CM

## 2022-10-14 DIAGNOSIS — M1A9XX0 Chronic gout, unspecified, without tophus (tophi): Secondary | ICD-10-CM

## 2022-10-14 DIAGNOSIS — E782 Mixed hyperlipidemia: Secondary | ICD-10-CM

## 2022-10-14 DIAGNOSIS — K56609 Unspecified intestinal obstruction, unspecified as to partial versus complete obstruction: Secondary | ICD-10-CM | POA: Diagnosis not present

## 2022-10-14 HISTORY — PX: LAPAROSCOPY: SHX197

## 2022-10-14 LAB — CBC
HCT: 33.2 % — ABNORMAL LOW (ref 39.0–52.0)
Hemoglobin: 10.7 g/dL — ABNORMAL LOW (ref 13.0–17.0)
MCH: 30.2 pg (ref 26.0–34.0)
MCHC: 32.2 g/dL (ref 30.0–36.0)
MCV: 93.8 fL (ref 80.0–100.0)
Platelets: 176 10*3/uL (ref 150–400)
RBC: 3.54 MIL/uL — ABNORMAL LOW (ref 4.22–5.81)
RDW: 14.2 % (ref 11.5–15.5)
WBC: 11.6 10*3/uL — ABNORMAL HIGH (ref 4.0–10.5)
nRBC: 0 % (ref 0.0–0.2)

## 2022-10-14 LAB — COMPREHENSIVE METABOLIC PANEL
ALT: 8 U/L (ref 0–44)
AST: 11 U/L — ABNORMAL LOW (ref 15–41)
Albumin: 3.2 g/dL — ABNORMAL LOW (ref 3.5–5.0)
Alkaline Phosphatase: 45 U/L (ref 38–126)
Anion gap: 10 (ref 5–15)
BUN: 24 mg/dL — ABNORMAL HIGH (ref 8–23)
CO2: 24 mmol/L (ref 22–32)
Calcium: 8.8 mg/dL — ABNORMAL LOW (ref 8.9–10.3)
Chloride: 110 mmol/L (ref 98–111)
Creatinine, Ser: 2.05 mg/dL — ABNORMAL HIGH (ref 0.61–1.24)
GFR, Estimated: 33 mL/min — ABNORMAL LOW (ref >60)
Glucose, Bld: 178 mg/dL — ABNORMAL HIGH (ref 70–99)
Potassium: 3.4 mmol/L — ABNORMAL LOW (ref 3.5–5.1)
Sodium: 144 mmol/L (ref 135–145)
Total Bilirubin: 0.6 mg/dL (ref 0.3–1.2)
Total Protein: 6.5 g/dL (ref 6.5–8.1)

## 2022-10-14 LAB — GLUCOSE, CAPILLARY
Glucose-Capillary: 168 mg/dL — ABNORMAL HIGH (ref 70–99)
Glucose-Capillary: 168 mg/dL — ABNORMAL HIGH (ref 70–99)
Glucose-Capillary: 169 mg/dL — ABNORMAL HIGH (ref 70–99)
Glucose-Capillary: 171 mg/dL — ABNORMAL HIGH (ref 70–99)
Glucose-Capillary: 183 mg/dL — ABNORMAL HIGH (ref 70–99)
Glucose-Capillary: 194 mg/dL — ABNORMAL HIGH (ref 70–99)
Glucose-Capillary: 199 mg/dL — ABNORMAL HIGH (ref 70–99)
Glucose-Capillary: 216 mg/dL — ABNORMAL HIGH (ref 70–99)

## 2022-10-14 LAB — MAGNESIUM: Magnesium: 1.6 mg/dL — ABNORMAL LOW (ref 1.7–2.4)

## 2022-10-14 SURGERY — LAPAROSCOPY, DIAGNOSTIC
Anesthesia: General

## 2022-10-14 MED ORDER — DEXAMETHASONE SODIUM PHOSPHATE 10 MG/ML IJ SOLN
INTRAMUSCULAR | Status: AC
  Filled 2022-10-14: qty 1

## 2022-10-14 MED ORDER — ONDANSETRON HCL 4 MG/2ML IJ SOLN
INTRAMUSCULAR | Status: DC | PRN
  Administered 2022-10-14: 4 mg via INTRAVENOUS

## 2022-10-14 MED ORDER — PHENYLEPHRINE 80 MCG/ML (10ML) SYRINGE FOR IV PUSH (FOR BLOOD PRESSURE SUPPORT)
PREFILLED_SYRINGE | INTRAVENOUS | Status: AC
  Filled 2022-10-14: qty 10

## 2022-10-14 MED ORDER — PROPOFOL 10 MG/ML IV BOLUS
INTRAVENOUS | Status: AC
  Filled 2022-10-14: qty 20

## 2022-10-14 MED ORDER — FENTANYL CITRATE (PF) 100 MCG/2ML IJ SOLN
INTRAMUSCULAR | Status: DC | PRN
  Administered 2022-10-14 (×5): 50 ug via INTRAVENOUS
  Administered 2022-10-14: 100 ug via INTRAVENOUS

## 2022-10-14 MED ORDER — HEPARIN SODIUM (PORCINE) 5000 UNIT/ML IJ SOLN
5000.0000 [IU] | Freq: Three times a day (TID) | INTRAMUSCULAR | Status: DC
  Administered 2022-10-15 – 2022-10-20 (×17): 5000 [IU] via SUBCUTANEOUS
  Filled 2022-10-14 (×17): qty 1

## 2022-10-14 MED ORDER — CEFAZOLIN SODIUM-DEXTROSE 2-4 GM/100ML-% IV SOLN
2.0000 g | INTRAVENOUS | Status: AC
  Administered 2022-10-14: 2 g via INTRAVENOUS
  Filled 2022-10-14: qty 100

## 2022-10-14 MED ORDER — CHLORHEXIDINE GLUCONATE CLOTH 2 % EX PADS: 6.0000 | MEDICATED_PAD | Freq: Once | CUTANEOUS | Status: DC

## 2022-10-14 MED ORDER — LIDOCAINE 2% (20 MG/ML) 5 ML SYRINGE
INTRAMUSCULAR | Status: DC | PRN
  Administered 2022-10-14: 60 mg via INTRAVENOUS

## 2022-10-14 MED ORDER — DEXAMETHASONE SODIUM PHOSPHATE 4 MG/ML IJ SOLN
INTRAMUSCULAR | Status: DC | PRN
  Administered 2022-10-14: 5 mg via INTRAVENOUS

## 2022-10-14 MED ORDER — SUGAMMADEX SODIUM 200 MG/2ML IV SOLN
INTRAVENOUS | Status: DC | PRN
  Administered 2022-10-14: 200 mg via INTRAVENOUS

## 2022-10-14 MED ORDER — PANTOPRAZOLE SODIUM 40 MG IV SOLR
40.0000 mg | INTRAVENOUS | Status: DC
  Administered 2022-10-14 – 2022-10-17 (×4): 40 mg via INTRAVENOUS
  Filled 2022-10-14 (×4): qty 10

## 2022-10-14 MED ORDER — PROPOFOL 10 MG/ML IV BOLUS
INTRAVENOUS | Status: DC | PRN
  Administered 2022-10-14: 140 mg via INTRAVENOUS

## 2022-10-14 MED ORDER — HYDROMORPHONE HCL 2 MG/ML IJ SOLN
INTRAMUSCULAR | Status: AC
  Filled 2022-10-14: qty 1

## 2022-10-14 MED ORDER — SUCCINYLCHOLINE CHLORIDE 200 MG/10ML IV SOSY
PREFILLED_SYRINGE | INTRAVENOUS | Status: AC
  Filled 2022-10-14: qty 10

## 2022-10-14 MED ORDER — FENTANYL CITRATE (PF) 250 MCG/5ML IJ SOLN
INTRAMUSCULAR | Status: AC
  Filled 2022-10-14: qty 5

## 2022-10-14 MED ORDER — LACTATED RINGERS IV SOLN: INTRAVENOUS | Status: DC

## 2022-10-14 MED ORDER — ACETAMINOPHEN 10 MG/ML IV SOLN
INTRAVENOUS | Status: AC
  Filled 2022-10-14: qty 100

## 2022-10-14 MED ORDER — SUCCINYLCHOLINE CHLORIDE 200 MG/10ML IV SOSY
PREFILLED_SYRINGE | INTRAVENOUS | Status: DC | PRN
  Administered 2022-10-14: 120 mg via INTRAVENOUS

## 2022-10-14 MED ORDER — FENTANYL CITRATE (PF) 100 MCG/2ML IJ SOLN
INTRAMUSCULAR | Status: AC
  Filled 2022-10-14: qty 2

## 2022-10-14 MED ORDER — LIDOCAINE HCL 2 % IJ SOLN
INTRAMUSCULAR | Status: AC
  Filled 2022-10-14: qty 20

## 2022-10-14 MED ORDER — PHENYLEPHRINE 80 MCG/ML (10ML) SYRINGE FOR IV PUSH (FOR BLOOD PRESSURE SUPPORT)
PREFILLED_SYRINGE | INTRAVENOUS | Status: DC | PRN
  Administered 2022-10-14: 120 ug via INTRAVENOUS

## 2022-10-14 MED ORDER — ROCURONIUM BROMIDE 10 MG/ML (PF) SYRINGE
PREFILLED_SYRINGE | INTRAVENOUS | Status: AC
  Filled 2022-10-14: qty 10

## 2022-10-14 MED ORDER — HYDROMORPHONE HCL 1 MG/ML IJ SOLN: 0.2500 mg | INTRAMUSCULAR | Status: DC | PRN

## 2022-10-14 MED ORDER — HYDROMORPHONE HCL 1 MG/ML IJ SOLN
INTRAMUSCULAR | Status: DC | PRN
  Administered 2022-10-14: .5 mg via INTRAVENOUS

## 2022-10-14 MED ORDER — MAGNESIUM SULFATE 4 GM/100ML IV SOLN
4.0000 g | Freq: Once | INTRAVENOUS | Status: AC
  Administered 2022-10-14: 4 g via INTRAVENOUS
  Filled 2022-10-14: qty 100

## 2022-10-14 MED ORDER — ROCURONIUM BROMIDE 10 MG/ML (PF) SYRINGE
PREFILLED_SYRINGE | INTRAVENOUS | Status: DC | PRN
  Administered 2022-10-14: 80 mg via INTRAVENOUS

## 2022-10-14 MED ORDER — CHLORHEXIDINE GLUCONATE 0.12 % MT SOLN: 15.0000 mL | Freq: Once | OROMUCOSAL | Status: DC

## 2022-10-14 MED ORDER — 0.9 % SODIUM CHLORIDE (POUR BTL) OPTIME
TOPICAL | Status: DC | PRN
  Administered 2022-10-14: 1000 mL

## 2022-10-14 MED ORDER — BUPIVACAINE-EPINEPHRINE (PF) 0.25% -1:200000 IJ SOLN
INTRAMUSCULAR | Status: AC
  Filled 2022-10-14: qty 30

## 2022-10-14 MED ORDER — ACETAMINOPHEN 10 MG/ML IV SOLN
INTRAVENOUS | Status: DC | PRN
  Administered 2022-10-14: 1000 mg via INTRAVENOUS

## 2022-10-14 MED ORDER — METOPROLOL TARTRATE 5 MG/5ML IV SOLN
2.5000 mg | Freq: Three times a day (TID) | INTRAVENOUS | Status: DC
  Administered 2022-10-14 – 2022-10-17 (×8): 2.5 mg via INTRAVENOUS
  Filled 2022-10-14 (×9): qty 5

## 2022-10-14 MED ORDER — HYDROMORPHONE HCL 1 MG/ML IJ SOLN
0.5000 mg | INTRAMUSCULAR | Status: DC | PRN
  Administered 2022-10-14 – 2022-10-15 (×5): 1 mg via INTRAVENOUS
  Filled 2022-10-14 (×6): qty 1

## 2022-10-14 MED ORDER — ACETAMINOPHEN 325 MG PO TABS
650.0000 mg | ORAL_TABLET | Freq: Four times a day (QID) | ORAL | Status: DC
  Administered 2022-10-14 – 2022-10-16 (×7): 650 mg via ORAL
  Filled 2022-10-14 (×9): qty 2

## 2022-10-14 MED ORDER — LIDOCAINE HCL (PF) 2 % IJ SOLN
INTRAMUSCULAR | Status: DC | PRN
  Administered 2022-10-14: 1.5 mg/kg/h

## 2022-10-14 MED ORDER — POTASSIUM CHLORIDE 10 MEQ/100ML IV SOLN
10.0000 meq | INTRAVENOUS | Status: AC
  Administered 2022-10-14 (×3): 10 meq via INTRAVENOUS
  Filled 2022-10-14 (×3): qty 100

## 2022-10-14 SURGICAL SUPPLY — 67 items
APPLIER CLIP 5 13 M/L LIGAMAX5 (MISCELLANEOUS)
APPLIER CLIP ROT 10 11.4 M/L (STAPLE)
BAG COUNTER SPONGE SURGICOUNT (BAG) IMPLANT
BLADE EXTENDED COATED 6.5IN (ELECTRODE) IMPLANT
BLADE SURG SZ10 CARB STEEL (BLADE) IMPLANT
CELLS DAT CNTRL 66122 CELL SVR (MISCELLANEOUS) ×1 IMPLANT
CHLORAPREP W/TINT 26 (MISCELLANEOUS) ×1 IMPLANT
CLIP APPLIE 5 13 M/L LIGAMAX5 (MISCELLANEOUS) IMPLANT
CLIP APPLIE ROT 10 11.4 M/L (STAPLE) IMPLANT
COVER MAYO STAND STRL (DRAPES) IMPLANT
COVER SURGICAL LIGHT HANDLE (MISCELLANEOUS) ×1 IMPLANT
DERMABOND ADVANCED .7 DNX12 (GAUZE/BANDAGES/DRESSINGS) IMPLANT
DRAPE SHEET LG 3/4 BI-LAMINATE (DRAPES) IMPLANT
DRAPE WARM FLUID 44X44 (DRAPES) IMPLANT
ELECT REM PT RETURN 15FT ADLT (MISCELLANEOUS) ×1 IMPLANT
GAUZE SPONGE 4X4 12PLY STRL (GAUZE/BANDAGES/DRESSINGS) ×1 IMPLANT
GLOVE INDICATOR 8.0 STRL GRN (GLOVE) ×1 IMPLANT
GLOVE SS BIOGEL STRL SZ 6 (GLOVE) ×1 IMPLANT
GLOVE SURG LX STRL 7.5 STRW (GLOVE) ×1 IMPLANT
GOWN STRL REUS W/ TWL LRG LVL3 (GOWN DISPOSABLE) IMPLANT
GOWN STRL REUS W/ TWL XL LVL3 (GOWN DISPOSABLE) IMPLANT
GOWN STRL REUS W/TWL LRG LVL3 (GOWN DISPOSABLE)
GOWN STRL REUS W/TWL XL LVL3 (GOWN DISPOSABLE)
HANDLE SUCTION POOLE (INSTRUMENTS) IMPLANT
IRRIG SUCT STRYKERFLOW 2 WTIP (MISCELLANEOUS)
IRRIGATION SUCT STRKRFLW 2 WTP (MISCELLANEOUS) IMPLANT
KIT BASIN OR (CUSTOM PROCEDURE TRAY) ×1 IMPLANT
KIT TURNOVER KIT A (KITS) IMPLANT
LEGGING LITHOTOMY PAIR STRL (DRAPES) IMPLANT
LIGASURE IMPACT 36 18CM CVD LR (INSTRUMENTS) IMPLANT
RELOAD PROXIMATE 75MM BLUE (ENDOMECHANICALS) ×2 IMPLANT
RELOAD STAPLE 75 3.8 BLU REG (ENDOMECHANICALS) IMPLANT
RETRACTOR WND ALEXIS 18 MED (MISCELLANEOUS) IMPLANT
RTRCTR WOUND ALEXIS 18CM MED (MISCELLANEOUS) ×1
SCISSORS LAP 5X35 DISP (ENDOMECHANICALS) ×1 IMPLANT
SHEARS HARMONIC ACE PLUS 36CM (ENDOMECHANICALS) IMPLANT
SLEEVE Z-THREAD 5X100MM (TROCAR) ×1 IMPLANT
SPIKE FLUID TRANSFER (MISCELLANEOUS) ×1 IMPLANT
STAPLER 90 3.5 STAND SLIM (STAPLE) ×1
STAPLER 90 3.5 STD SLIM (STAPLE) IMPLANT
STAPLER PROXIMATE 75MM BLUE (STAPLE) IMPLANT
STAPLER VISISTAT 35W (STAPLE) IMPLANT
STRIP CLOSURE SKIN 1/2X4 (GAUZE/BANDAGES/DRESSINGS) IMPLANT
SUCTION POOLE HANDLE (INSTRUMENTS)
SUT MNCRL AB 4-0 PS2 18 (SUTURE) IMPLANT
SUT PDS AB 0 CTX 36 PDP370T (SUTURE) IMPLANT
SUT PDS AB 1 TP1 96 (SUTURE) IMPLANT
SUT PROLENE 2 0 KS (SUTURE) IMPLANT
SUT PROLENE 2 0 SH DA (SUTURE) IMPLANT
SUT SILK 2 0 (SUTURE)
SUT SILK 2 0 SH CR/8 (SUTURE) IMPLANT
SUT SILK 2-0 18XBRD TIE 12 (SUTURE) IMPLANT
SUT SILK 3 0 (SUTURE)
SUT SILK 3 0 SH CR/8 (SUTURE) IMPLANT
SUT SILK 3-0 18XBRD TIE 12 (SUTURE) IMPLANT
SUT VIC AB 3-0 SH 8-18 (SUTURE) IMPLANT
SUT VICRYL 0 UR6 27IN ABS (SUTURE) IMPLANT
SYR BULB IRRIG 60ML STRL (SYRINGE) IMPLANT
SYS LAPSCP GELPORT 120MM (MISCELLANEOUS) ×1
SYSTEM LAPSCP GELPORT 120MM (MISCELLANEOUS) IMPLANT
TOWEL OR 17X26 10 PK STRL BLUE (TOWEL DISPOSABLE) ×1 IMPLANT
TOWEL OR NON WOVEN STRL DISP B (DISPOSABLE) ×1 IMPLANT
TRAY FOLEY MTR SLVR 16FR STAT (SET/KITS/TRAYS/PACK) ×1 IMPLANT
TRAY LAPAROSCOPIC (CUSTOM PROCEDURE TRAY) ×1 IMPLANT
TROCAR 11X100 Z THREAD (TROCAR) IMPLANT
TROCAR Z-THREAD OPTICAL 5X100M (TROCAR) ×1 IMPLANT
YANKAUER SUCT BULB TIP NO VENT (SUCTIONS) IMPLANT

## 2022-10-14 NOTE — Transfer of Care (Signed)
Immediate Anesthesia Transfer of Care Note  Patient: Jacob Guerrero  Procedure(s) Performed: LAPAROSCOPY DIAGNOSTIC and SMALL BOWEL RESECTION  Patient Location: PACU  Anesthesia Type:General  Level of Consciousness: awake, drowsy, and patient cooperative  Airway & Oxygen Therapy: Patient Spontanous Breathing and Patient connected to face mask oxygen  Post-op Assessment: Report given to RN and Post -op Vital signs reviewed and stable  Post vital signs: Reviewed and stable  Last Vitals:  Vitals Value Taken Time  BP 153/82 10/14/22 1515  Temp    Pulse 73 10/14/22 1515  Resp 13 10/14/22 1515  SpO2 100 % 10/14/22 1515  Vitals shown include unvalidated device data.  Last Pain:  Vitals:   10/14/22 1209  TempSrc:   PainSc: 2       Patients Stated Pain Goal: 2 ([id number])  Complications: No notable events documented.

## 2022-10-14 NOTE — Anesthesia Postprocedure Evaluation (Signed)
Anesthesia Post Note  Patient: Jacob Guerrero  Procedure(s) Performed: LAPAROSCOPY DIAGNOSTIC and SMALL BOWEL RESECTION     Patient location during evaluation: PACU Anesthesia Type: General Level of consciousness: awake Pain management: pain level controlled Vital Signs Assessment: post-procedure vital signs reviewed and stable Respiratory status: spontaneous breathing, nonlabored ventilation and respiratory function stable Cardiovascular status: blood pressure returned to baseline and stable Postop Assessment: no apparent nausea or vomiting Anesthetic complications: no   No notable events documented.  Last Vitals:  Vitals:   10/14/22 1530 10/14/22 1545  BP: (!) 153/92 (!) 148/81  Pulse: 72 72  Resp: 16 15  Temp:    SpO2: 100% 94%    Last Pain:  Vitals:   10/14/22 1545  TempSrc:   PainSc: 0-No pain                 Nilda Simmer

## 2022-10-14 NOTE — Progress Notes (Signed)
Patient ID: Jacob Guerrero, male   DOB: 12-Feb-1946, 77 y.o.   MRN: [id number] [facility] Surgery Progress Note     Subjective: CC-  Developed constant pain with cramping exacerbations yesterday after a small amt of PO intake (one bite of grits and some liquids), states the pain lasted until 4-5 AM. Reports feeling better this AM. Denies nausea or vomiting. Reports multiple small volume loose stools yesterday, minimal flatus.   Confirms that he has never had abdominal surgery.  Objective: Vital signs in last 24 hours: Temp:  [98 F (36.7 C)-99.9 F (37.7 C)] 98.9 F (37.2 C) (02/28 0928) Pulse Rate:  [72-80] 74 (02/28 0928) Resp:  [14-18] 14 (02/28 0928) BP: (120-154)/(61-88) 154/61 (02/28 0928) SpO2:  [94 %-97 %] 97 % (02/28 0928) Last BM Date : 10/14/22  Intake/Output from previous day: 02/27 0701 - 02/28 0700 In: 2048.8 [P.O.:300; I.V.:1748.8] Out: 200 [Urine:200] Intake/Output this shift: No intake/output data recorded.  PE: Gen:  Alert, NAD, pleasant Abd: soft, mild distention, globally tender, worse over central upper abdomen and right hemi-abdomen with guarding.  Lab Results:  Recent Labs    10/13/22 0502 10/14/22 0438  WBC 9.7 11.6*  HGB 10.5* 10.7*  HCT 33.0* 33.2*  PLT 169 176   BMET Recent Labs    10/13/22 0502 10/13/22 1414  NA 145 141  K 4.4 3.5  CL 112* 110  CO2 24 23  GLUCOSE 182* 204*  BUN 32* 27*  CREATININE 2.24* 2.09*  CALCIUM 9.4 8.9   PT/INR No results for input(s): "LABPROT", "INR" in the last 72 hours. CMP     Component Value Date/Time   NA 141 10/13/2022 1414   K 3.5 10/13/2022 1414   CL 110 10/13/2022 1414   CO2 23 10/13/2022 1414   GLUCOSE 204 (H) 10/13/2022 1414   BUN 27 (H) 10/13/2022 1414   CREATININE 2.09 (H) 10/13/2022 1414   CALCIUM 8.9 10/13/2022 1414   PROT 6.5 10/13/2022 1414   ALBUMIN 3.2 (L) 10/13/2022 1414   AST 11 (L) 10/13/2022 1414   ALT 8 10/13/2022 1414   ALKPHOS 44 10/13/2022 1414    BILITOT 0.6 10/13/2022 1414   GFRNONAA 32 (L) 10/13/2022 1414   Lipase     Component Value Date/Time   LIPASE 50 10/09/2022 1303       Studies/Results: DG Abd 1 View  Result Date: 10/14/2022 CLINICAL DATA:  Abdominal pain. EXAM: ABDOMEN - 1 VIEW COMPARISON:  October 13, 2022. FINDINGS: Increased small bowel dilatation is noted concerning for distal small bowel obstruction. No colonic dilatation is noted. IMPRESSION: Increased small bowel dilatation is noted concerning for distal small bowel obstruction. Electronically Signed   By: Marijo Conception M.D.   On: 10/14/2022 08:15   DG Abd 1 View  Result Date: 10/13/2022 CLINICAL DATA:  Abdominal pain EXAM: ABDOMEN - 1 VIEW COMPARISON:  Yesterday FINDINGS: 2 portable radiographs, presumably both supine. Pacer/ICD is incompletely imaged. Increased number of dilated small bowel loops. Example loop at 3.6 cm, similar. Normal caliber colon. Low pelvis excluded. No gross free intraperitoneal air. IMPRESSION: Increased number of moderately dilated small bowel loops. Favor progressive small-bowel obstruction. Electronically Signed   By: Abigail Miyamoto M.D.   On: 10/13/2022 08:23    Anti-infectives: Anti-infectives (From admission, onward)    None        Assessment/Plan pSBO vs gastroenteritis with obstructive component - passed SBO protocol, contrast in colon. Symptoms improved initially but now having minimal flatus, ongoing pain, and slight  increase in small bowel distention on KUB. Concerned for ongoing pSBO. This patient may need diagnostic laparoscopy. Will discuss with Dr. [name]. Leave NPO for now.  ID - none FEN - SOFT  VTE - sq heparin Foley - none   I reviewed last 24 h vitals and pain scores, last 48 h intake and output, last 24 h labs and trends, and last 24 h imaging results.    LOS: 5 days    [facility] 10/14/2022, 9:32 AM Please see Amion for pager number during day hours  7:00am-4:30pm

## 2022-10-14 NOTE — Progress Notes (Signed)
PROGRESS NOTE    Albin Birdsell  [id number] DOB: [birth year] DOA: 10/09/2022 PCP: Pcp, No    Chief Complaint  Patient presents with   Abdominal Pain    Brief Narrative:  Jacob Guerrero is a 77 y.o. male with medical history significant of CKD, DM2, HTN, Gout, Chronic bronchitis, GERD, glaucoma, RBBB, CAD with CABG and pacemaker, BPH, anemia, OA, HLD who presents for abdominal pain and found to have an SBO.  Patient was admitted to the floor started NG tube IV fluids and n.p.o. status.  NG tube came out 2 days ago overnight.  He is now not able to tolerate a diet due to abdominal pain.  He is nauseous surgery has been reconsulted.  Patient seen again by general surgery on 10/14/2022 who are recommending diagnostic laparoscopy and possible laparotomy as KUB done 10/14/2022 with increased small bowel dilatation consistent with obstruction.   Assessment & Plan:   Principal Problem:   SBO (small bowel obstruction) (HCC) Active Problems:   BPH (benign prostatic hyperplasia)   CAD (coronary artery disease)   CKD (chronic kidney disease) stage 3, GFR 30-59 ml/min (HCC)   Diabetes mellitus (HCC)   Essential hypertension   GERD (gastroesophageal reflux disease)   Gout   HLD (hyperlipidemia)  #1 partial small bowel obstruction versus gastroenteritis with obstructive component -Patient noted to have presented with abdominal pain, some nausea and vomiting CT abdomen and pelvis done concerning for small bowel obstruction with transition point in the right abdomen, short segment of wall thickening of the small bowel at the transition point. -KUB 10/13/2022 with increased number of moderately dilated small bowel loops favor progressive small bowel obstruction. -Patient was downgraded to clear liquids on 10/13/2022. -Patient initially seen by general surgery, patient improved clinically and then noted to have passed the small bowel obstruction protocol with contrast noted in the  colon. -Patient however noted to have minimal flatus, some loose stool minimal, ongoing pain, some slight increase in abdominal distention. -KUB done on the 04/06/2023 with increased small bowel dilatation concerning for distal small bowel obstruction. -Patient reassessed by general surgery who reviewed KUB films and recommending diagnostic laparoscopy and possible laparotomy scheduled for today. -Patient made n.p.o. this morning. -Supportive care. -Per general surgery.  2.  AKI on CKD stage IIIb with metabolic acidosis -Likely secondary to prerenal azotemia in the setting of diuretics, secondary to SBO. -Patient hydrated with IV fluids with improving renal function, creatinine currently at 2.05. -Continue gentle hydration.  3.  Hypokalemia/hypomagnesemia -Potassium noted at 3.4 today, magnesium at 1.6. -Magnesium sulfate 4 g IV x 1. -KCl 10 mEq every hour x 4 rounds.  4.  Type 2 diabetes mellitus -Hemoglobin A1c 7.1 (2/23/ 2024). -CBG 168 this morning. -Hold oral hypoglycemic agents. -SSI.  5.  GERD -IV PPI.  6.  BPH -Hold home meds.  7.  History of gout -Hold allopurinol, once started on a diet will resume.  8.  Hyperlipidemia -Hold statin.  9.  CAD -Coreg and Entresto and Lasix on hold. -Start low-dose IV beta-blocker.   DVT prophylaxis: Heparin Code Status: Full Family Communication: Updated patient.  No family at bedside. Disposition: Home when clinically improved and cleared by general surgery.  Status is: Inpatient Remains inpatient appropriate because: Severity of illness   Consultants:  General surgery: Dr. Barry Dienes 10/09/2022  Procedures:  CT abdomen and pelvis 10/09/2022 Abdominal films 10/11/2022, 10/12/2022, 10/13/2022, 10/14/2022 Small bowel obstruction protocol  Antimicrobials:  Anti-infectives (From admission, onward)    Start  Dose/Rate Route Frequency Ordered Stop   10/14/22 1153  ceFAZolin (ANCEF) IVPB 2g/100 mL premix        2 g 200 mL/hr  over 30 Minutes Intravenous On call to O.R. 10/14/22 1148 10/14/22 1326         Subjective: In preop area.  No chest pain.  No shortness of breath.  Stated just had some liquid stools yesterday.  Complaining of abdominal distention, and worsening abdominal pain.  Stated when he tried oral intake yesterday had significant abdominal pain.  Objective: Vitals:   10/14/22 0750 10/14/22 0928 10/14/22 1151 10/14/22 1211  BP:  (!) 154/61 (!) 147/62   Pulse:  74 70   Resp:  14 15   Temp:  98.9 F (37.2 C) 99.4 F (37.4 C)   TempSrc:  Oral Oral   SpO2: 94% 97% 94%   Weight:    92.5 kg  Height:        Intake/Output Summary (Last 24 hours) at 10/14/2022 1413 Last data filed at 10/14/2022 1400 Gross per 24 hour  Intake 2128.79 ml  Output --  Net 2128.79 ml   Filed Weights   10/09/22 1224 10/14/22 1211  Weight: 92.5 kg 92.5 kg    Examination:  General exam: Appears calm and comfortable  Respiratory system: Clear to auscultation anterior lung fields.  No wheezes, no crackles, no rhonchi.  Fair air movement.  Speaking in full sentences.Marland Kitchen Respiratory effort normal. Cardiovascular system: S1 & S2 heard, RRR. No JVD, murmurs, rubs, gallops or clicks. No pedal edema. Gastrointestinal system: Abdomen is nondistended, soft and tender to palpation in mid abdominal region.  Hypoactive bowel sounds.  No rebound.  No guarding.  Central nervous system: Alert and oriented. No focal neurological deficits. Extremities: Symmetric 5 x 5 power. Skin: No rashes, lesions or ulcers Psychiatry: Judgement and insight appear normal. Mood & affect appropriate.     Data Reviewed: I have personally reviewed following labs and imaging studies  CBC: Recent Labs  Lab 10/09/22 1303 10/09/22 1814 10/10/22 0534 10/11/22 0817 10/13/22 0502 10/14/22 0438  WBC 15.6* 14.4* 7.8 9.0 9.7 11.6*  NEUTROABS 14.1*  --   --   --   --   --   HGB 12.3* 11.9* 11.3* 10.5* 10.5* 10.7*  HCT 37.3* 35.9* 34.3* 33.0* 33.0*  33.2*  MCV 91.9 92.5 90.3 92.4 93.5 93.8  PLT 241 230 198 169 169 0000000    Basic Metabolic Panel: Recent Labs  Lab 10/10/22 0534 10/11/22 0817 10/13/22 0502 10/13/22 1414 10/14/22 0904  NA 135 139 145 141 144  K 4.5 4.4 4.4 3.5 3.4*  CL 103 104 112* 110 110  CO2 21* '23 24 23 24  '$ GLUCOSE 211* 178* 182* 204* 178*  BUN 63* 58* 32* 27* 24*  CREATININE 3.20* 2.93* 2.24* 2.09* 2.05*  CALCIUM 9.3 9.2 9.4 8.9 8.8*  MG  --   --   --   --  1.6*    GFR: Estimated Creatinine Clearance: 37 mL/min (A) (by C-G formula based on SCr of 2.05 mg/dL (H)).  Liver Function Tests: Recent Labs  Lab 10/09/22 1303 10/11/22 0817 10/13/22 1414 10/14/22 0904  AST 24 15 11* 11*  ALT '12 11 8 8  '$ ALKPHOS 59 43 44 45  BILITOT 0.6 0.6 0.6 0.6  PROT 9.0* 6.6 6.5 6.5  ALBUMIN 4.6 3.5 3.2* 3.2*    CBG: Recent Labs  Lab 10/13/22 2117 10/14/22 0516 10/14/22 0800 10/14/22 1137 10/14/22 1158  GLUCAP 166* 168* 168* 183*  169*     Recent Results (from the past 240 hour(s))  Resp panel by RT-PCR (RSV, Flu A&B, Covid) Anterior Nasal Swab     Status: None   Collection Time: 10/09/22  1:14 PM   Specimen: Anterior Nasal Swab  Result Value Ref Range Status   SARS Coronavirus 2 by RT PCR NEGATIVE NEGATIVE Final    Comment: (NOTE) SARS-CoV-2 target nucleic acids are NOT DETECTED.  The SARS-CoV-2 RNA is generally detectable in upper respiratory specimens during the acute phase of infection. The lowest concentration of SARS-CoV-2 viral copies this assay can detect is 138 copies/mL. A negative result does not preclude SARS-Cov-2 infection and should not be used as the sole basis for treatment or other patient management decisions. A negative result may occur with  improper specimen collection/handling, submission of specimen other than nasopharyngeal swab, presence of viral mutation(s) within the areas targeted by this assay, and inadequate number of viral copies(<138 copies/mL). A negative result must  be combined with clinical observations, patient history, and epidemiological information. The expected result is Negative.  Fact Sheet for Patients:  EntrepreneurPulse.com.au  Fact Sheet for Healthcare Providers:  IncredibleEmployment.be  This test is no t yet approved or cleared by the [place] FDA and  has been authorized for detection and/or diagnosis of SARS-CoV-2 by FDA under an Emergency Use Authorization (EUA). This EUA will remain  in effect (meaning this test can be used) for the duration of the COVID-19 declaration under Section 564(b)(1) of the Act, 21 U.S.C.section 360bbb-3(b)(1), unless the authorization is terminated  or revoked sooner.       Influenza A by PCR NEGATIVE NEGATIVE Final   Influenza B by PCR NEGATIVE NEGATIVE Final    Comment: (NOTE) The Xpert Xpress SARS-CoV-2/FLU/RSV plus assay is intended as an aid in the diagnosis of influenza from Nasopharyngeal swab specimens and should not be used as a sole basis for treatment. Nasal washings and aspirates are unacceptable for Xpert Xpress SARS-CoV-2/FLU/RSV testing.  Fact Sheet for Patients: EntrepreneurPulse.com.au  Fact Sheet for Healthcare Providers: IncredibleEmployment.be  This test is not yet approved or cleared by the [place] FDA and has been authorized for detection and/or diagnosis of SARS-CoV-2 by FDA under an Emergency Use Authorization (EUA). This EUA will remain in effect (meaning this test can be used) for the duration of the COVID-19 declaration under Section 564(b)(1) of the Act, 21 U.S.C. section 360bbb-3(b)(1), unless the authorization is terminated or revoked.     Resp Syncytial Virus by PCR NEGATIVE NEGATIVE Final    Comment: (NOTE) Fact Sheet for Patients: EntrepreneurPulse.com.au  Fact Sheet for Healthcare Providers: IncredibleEmployment.be  This test is not  yet approved or cleared by the [place] FDA and has been authorized for detection and/or diagnosis of SARS-CoV-2 by FDA under an Emergency Use Authorization (EUA). This EUA will remain in effect (meaning this test can be used) for the duration of the COVID-19 declaration under Section 564(b)(1) of the Act, 21 U.S.C. section 360bbb-3(b)(1), unless the authorization is terminated or revoked.  Performed at [facility], [place] [address] [place], [place] [address]          Radiology Studies: DG Abd 1 View  Result Date: 10/14/2022 CLINICAL DATA:  Abdominal pain. EXAM: ABDOMEN - 1 VIEW COMPARISON:  October 13, 2022. FINDINGS: Increased small bowel dilatation is noted concerning for distal small bowel obstruction. No colonic dilatation is noted. IMPRESSION: Increased small bowel dilatation is noted concerning for distal small bowel obstruction. Electronically Signed  By: Marijo Conception M.D.   On: 10/14/2022 08:15   DG Abd 1 View  Result Date: 10/13/2022 CLINICAL DATA:  Abdominal pain EXAM: ABDOMEN - 1 VIEW COMPARISON:  Yesterday FINDINGS: 2 portable radiographs, presumably both supine. Pacer/ICD is incompletely imaged. Increased number of dilated small bowel loops. Example loop at 3.6 cm, similar. Normal caliber colon. Low pelvis excluded. No gross free intraperitoneal air. IMPRESSION: Increased number of moderately dilated small bowel loops. Favor progressive small-bowel obstruction. Electronically Signed   By: Abigail Miyamoto M.D.   On: 10/13/2022 08:23        Scheduled Meds:  [MAR Hold] bisacodyl  10 mg Rectal Once   [MAR Hold] budesonide  0.25 mg Nebulization BID   chlorhexidine  15 mL Mouth/Throat Once   Chlorhexidine Gluconate Cloth  6 each Topical Once   [MAR Hold] dorzolamide  1 drop Both Eyes TID   [MAR Hold] heparin injection (subcutaneous)  5,000 Units Subcutaneous Q8H   [MAR Hold] insulin aspart  0-15 Units Subcutaneous Q4H   metoprolol tartrate  2.5  mg Intravenous Q8H   [MAR Hold] Netarsudil-Latanoprost  1 drop Both Eyes QHS   [MAR Hold] pantoprazole (PROTONIX) IV  40 mg Intravenous Q24H   [MAR Hold] sodium chloride flush  3 mL Intravenous Q12H   Continuous Infusions:  lactated ringers 125 mL/hr at 10/14/22 0948   lactated ringers     magnesium sulfate bolus IVPB     potassium chloride       LOS: 5 days    Time spent: 35 minutes    Irine Seal, MD [facility]   To contact the attending provider between 7A-7P or the covering provider during after hours 7P-7A, please log into the web site [email or web address] and access using universal [place] password for that web site. If you do not have the password, please call the hospital operator.  10/14/2022, 2:13 PM

## 2022-10-14 NOTE — Progress Notes (Signed)
Mobility Specialist - Progress Note   10/14/22 0946  Mobility  Activity Ambulated with assistance in hallway;Ambulated with assistance to bathroom  Level of Assistance Independent after set-up  Assistive Device Other (Comment) (IV Pole)  Distance Ambulated (ft) 500 ft  Activity Response Tolerated well  Mobility Referral Yes  $Mobility charge 1 Mobility   Pt received in bed and agreeable to mobility. Pt did have a BM. NT notified. During ambulation, pt c/o stomach feeling tender. No other complaints during session. Pt to recliner after session with all needs met & call bell in reach.   [facility]

## 2022-10-14 NOTE — Anesthesia Procedure Notes (Signed)
Procedure Name: Intubation Date/Time: 10/14/2022 1:25 PM  Performed by: Claudia Desanctis, CRNAPre-anesthesia Checklist: Patient identified, Emergency Drugs available, Suction available and Patient being monitored Patient Re-evaluated:Patient Re-evaluated prior to induction Oxygen Delivery Method: Circle system utilized Preoxygenation: Pre-oxygenation with 100% oxygen Induction Type: IV induction, Rapid sequence and Cricoid Pressure applied Laryngoscope Size: Miller and 3 Grade View: Grade I Tube type: Oral Tube size: 8.0 mm Number of attempts: 1 Airway Equipment and Method: Stylet Placement Confirmation: ETT inserted through vocal cords under direct vision, positive ETCO2 and breath sounds checked- equal and bilateral Secured at: 22 cm Tube secured with: Tape Dental Injury: Teeth and Oropharynx as per pre-operative assessment

## 2022-10-14 NOTE — Op Note (Signed)
Date: 10/14/22  Patient: Jacob Guerrero MRN: [id number]  Preoperative Diagnosis: Small bowel obstruction Postoperative Diagnosis: Small bowel mass  Procedure: Diagnostic laparoscopy, laparoscopic-assisted small bowel resection  Surgeon: Michaelle Birks, MD Assistant: Obie Dredge, PA-C  EBL: Minimal  Anesthesia: General endotracheal  Specimens: Small bowel resection  Indications: Jacob Guerrero is a 77 yo male who presented with abdominal pain, nausea and vomiting, with imaging findings concerning for a small bowel obstruction vs enteritis. He was initially treated nonoperatively, but has had persistent abdominal pain with progressive small bowel dilation on abdominal plain films. He has not had any prior abdominal surgeries. After a discussion of the treatment options, diagnostic laparoscopy was recommended and the patient agreed to proceed.  Findings: Diffuse small bowel dilation with distal decompression, without a distinct transition zone. A nodule was identified on the surface of the small bowel in the ileum, and on palpation this appeared to extend into an intraluminal mass. A small bowel resection was performed, a total length of about 12cm. No other small bowel abnormalities or peritoneal nodules were noted.  Procedure details: Informed consent was obtained in the preoperative area prior to the procedure. The patient was brought to the operating room and placed on the table in the supine position. General anesthesia was induced and appropriate lines and drains were placed for intraoperative monitoring. Perioperative antibiotics were administered per SCIP guidelines. The abdomen was prepped and draped in the usual sterile fashion. A pre-procedure timeout was taken verifying patient identity, surgical site and procedure to be performed.  A small supraumbilical skin incision was made and the subcutaneous tissue was divided with cautery to expose the fascia. The fascia was elevated  and opened sharply, the peritoneal cavity was visualized, and a Hassan trocar was placed. The abdomen was insufflated. Two 42m ports were placed, on in the LLQ and one in the LUQ, both under direct visualization. The small bowel appeared mildly dilated. No peritoneal nodules or liver nodules were identified. The ligament of Treitz was identified, and the small bowel was run starting proximally and working distally. The proximal bowel was decompressed, but the mid small bowel was mildly dilated, with a gradual transition to decompressed bowel at the terminal ileum. No distinct transition point was identified. There was a small 558mnodule on the ileum adjacent to the mesentery, which felt firm and was tethered to the adjacent mesenteric fat. The entire small bowel was run again and no other abnormalities were identified. The umbilical skin incision was extended slightly and a small wound protector was placed. The segment of ileum with the nodule was exteriorized and examined. The surface nodule was palpated and appeared to extend into the lumen of the small bowel. I had one of my partners examine this as well, and we agreed that this was concerning for a partially obstructive mass and made the decision to perform a small bowel resection. Mesenteric windows were created about 5cm proximal and distal to the mass, and the bowel was transected at each location with a 7553mI stapler with blue loads. The mesentery was then divided with Ligasure, taking a wedge of mesentery with the specimen. The specimen was passed off the field and sent for routine pathology. The ends of the small bowel were then placed next to each other in antiperistaltic fashion, and enterotomies were made on the antimesenteric border. A side-to-side ileo-ileal anastomosis was created with a 63m89mA stapler with a blue load, and the common enterotomy was closed with a TA60 blue stapler. There  was oozing from the TA staple line, which was oversewn with  3-0 Vicryl sutures. A 3-0 silk stay suture was placed at the apex of the anastomosis to minimize tension.  The mesenteric defect was closed with 3-0 silk figure-of-eight sutures. The bowel was placed back into the abdomen and a Gelport cap was placed on the wound protector. The abdomen was insufflated. The anastomosis lay in proper orientation with no bleeding and no twisting of the mesentery. The ports and wound protector were removed and the abdomen was desufflated. The fascia was closed at the umbilical incision with running 1 PDS suture. Scarpa's layer was closed with running 3-0 Vicryl suture. The skin at all port sites was closed with subcuticular 4-0 monocryl suture. Dermabond was applied.  The patient tolerated the procedure well with no apparent complications. All counts were correct x2 at the end of the procedure. The patient was extubated and taken to PACU in stable condition.  Michaelle Birks, MD 10/14/22 2:46 PM

## 2022-10-14 NOTE — Anesthesia Preprocedure Evaluation (Addendum)
Anesthesia Evaluation  Patient identified by MRN, date of birth, ID band Patient awake    Reviewed: Allergy & Precautions, H&P , NPO status , Patient's Chart, lab work & pertinent test results, reviewed documented beta blocker date and time   History of Anesthesia Complications Negative for: history of anesthetic complications  Airway Mallampati: III  TM Distance: >3 FB Neck ROM: Full    Dental  (+) Dental Advisory Given   Pulmonary neg pulmonary ROS   Pulmonary exam normal breath sounds clear to auscultation       Cardiovascular hypertension, Pt. on medications and Pt. 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    * Left ventricular systolic function is moderately reduced with an ejection  fraction of 35 % by Simpson's biplane.    * Global hypokinesis of the left ventricle.    * Left ventricular chamber size is moderately enlarged.    * Left ventricular diastolic function: indeterminate.    * There is asymmetrical left ventricular hypertrophy with a mildly thickened  posterior wall.    * Left atrial chamber is normal with a left atrial volume index biplane  method of disk (BP MOD) of 21 ml/m^2.    * There is mild to moderate mitral valve regurgitation.    * There is no aortic valve stenosis.    * Right ventricular chamber dimension is normal.    * Right atrial chamber size is normal.    * Tricuspid annular plane systolic excursion (TAPSE) is normal, 1.7 cm.    * Right ventricular systolic function is normal. TAPSV is 9.1 cm/s.    * Mild pulmonary hypertension, estimated pulmonary arterial systolic  pressure is 37 mmHg.    * There is no pericardial effusion.    * For the indication of atherosclerotic heart disease of native coronary  artery without angina pectoris, findings  are EF 35% by simpsons, visually  25-30%.     Neuro/Psych negative neurological ROS  negative psych ROS   GI/Hepatic Neg liver ROS,GERD  Medicated,,  Endo/Other  diabetes, Type 2, Oral Hypoglycemic Agents    Renal/GU Renal InsufficiencyRenal disease  negative genitourinary   Musculoskeletal  (+) Arthritis , Osteoarthritis,    Abdominal   Peds  Hematology negative hematology ROS (+)   Anesthesia Other Findings gout  Reproductive/Obstetrics negative OB ROS                             Anesthesia Physical Anesthesia Plan  ASA: 4 and emergent  Anesthesia Plan: General   Post-op Pain Management: Ofirmev IV (intra-op)*   Induction: Intravenous, Rapid sequence and Cricoid pressure planned  PONV Risk Score and Plan: 3 and Ondansetron, Dexamethasone and Treatment may vary due to age or medical condition  Airway Management Planned: Oral ETT  Additional Equipment: Arterial line  Intra-op Plan:   Post-operative Plan: Extubation in OR  Informed Consent: I have reviewed the patients History and Physical, chart, labs and discussed the procedure including the risks, benefits and alternatives for the proposed anesthesia with the patient or authorized representative who has indicated his/her understanding and acceptance.     Dental advisory given  Plan Discussed with: CRNA and Anesthesiologist  Anesthesia Plan Comments: (Risks of general anesthesia discussed including, but not limited to, sore throat, hoarse voice, chipped/damaged teeth,  injury to vocal cords, nausea and vomiting, allergic reactions, lung infection, heart attack, stroke, and death. All questions answered. )       Anesthesia Quick Evaluation

## 2022-10-14 NOTE — Care Management Important Message (Signed)
Important Message  Patient Details IM Letter given. Name: Jacob Guerrero MRN: [id number] Date of Birth: 08-20-1945   Medicare Important Message Given:  Yes     Kerin Salen 10/14/2022, 10:45 AM

## 2022-10-14 NOTE — Anesthesia Procedure Notes (Signed)
Arterial Line Insertion Start/End2/28/2024 1:06 PM, 10/14/2022 1:12 PM Performed by: Claudia Desanctis, CRNA, CRNA  Patient location: Pre-op. Preanesthetic checklist: patient identified, IV checked, site marked, risks and benefits discussed, surgical consent, monitors and equipment checked, pre-op evaluation, timeout performed and anesthesia consent Lidocaine 1% used for infiltration radial was placed Catheter size: 20 G Hand hygiene performed  and maximum sterile barriers used   Attempts: 1 Procedure performed without using ultrasound guided technique. Following insertion, dressing applied. Post procedure assessment: normal and unchanged  Patient tolerated the procedure well with no immediate complications.

## 2022-10-15 ENCOUNTER — Encounter (HOSPITAL_COMMUNITY): Payer: Self-pay | Admitting: Surgery

## 2022-10-15 DIAGNOSIS — K56609 Unspecified intestinal obstruction, unspecified as to partial versus complete obstruction: Secondary | ICD-10-CM | POA: Diagnosis not present

## 2022-10-15 DIAGNOSIS — N400 Enlarged prostate without lower urinary tract symptoms: Secondary | ICD-10-CM | POA: Diagnosis not present

## 2022-10-15 DIAGNOSIS — I2585 Chronic coronary microvascular dysfunction: Secondary | ICD-10-CM | POA: Diagnosis not present

## 2022-10-15 DIAGNOSIS — I10 Essential (primary) hypertension: Secondary | ICD-10-CM | POA: Diagnosis not present

## 2022-10-15 LAB — GLUCOSE, CAPILLARY
Glucose-Capillary: 169 mg/dL — ABNORMAL HIGH (ref 70–99)
Glucose-Capillary: 172 mg/dL — ABNORMAL HIGH (ref 70–99)
Glucose-Capillary: 148 mg/dL — ABNORMAL HIGH (ref 70–99)
Glucose-Capillary: 187 mg/dL — ABNORMAL HIGH (ref 70–99)
Glucose-Capillary: 166 mg/dL — ABNORMAL HIGH (ref 70–99)
Glucose-Capillary: 150 mg/dL — ABNORMAL HIGH (ref 70–99)

## 2022-10-15 LAB — CBC WITH DIFFERENTIAL/PLATELET
Abs Immature Granulocytes: 0.11 10*3/uL — ABNORMAL HIGH (ref 0.00–0.07)
Basophils Absolute: 0 10*3/uL (ref 0.0–0.1)
Basophils Relative: 0 %
Eosinophils Absolute: 0 10*3/uL (ref 0.0–0.5)
Eosinophils Relative: 0 %
HCT: 33.3 % — ABNORMAL LOW (ref 39.0–52.0)
Hemoglobin: 10.8 g/dL — ABNORMAL LOW (ref 13.0–17.0)
Immature Granulocytes: 1 %
Lymphocytes Relative: 8 %
Lymphs Abs: 1.3 10*3/uL (ref 0.7–4.0)
MCH: 30.2 pg (ref 26.0–34.0)
MCHC: 32.4 g/dL (ref 30.0–36.0)
MCV: 93 fL (ref 80.0–100.0)
Monocytes Absolute: 2 10*3/uL — ABNORMAL HIGH (ref 0.1–1.0)
Monocytes Relative: 12 %
Neutro Abs: 12.7 10*3/uL — ABNORMAL HIGH (ref 1.7–7.7)
Neutrophils Relative %: 79 %
Platelets: 160 10*3/uL (ref 150–400)
RBC: 3.58 MIL/uL — ABNORMAL LOW (ref 4.22–5.81)
RDW: 14.4 % (ref 11.5–15.5)
WBC: 16.2 10*3/uL — ABNORMAL HIGH (ref 4.0–10.5)
nRBC: 0 % (ref 0.0–0.2)

## 2022-10-15 LAB — BASIC METABOLIC PANEL
Anion gap: 10 (ref 5–15)
BUN: 24 mg/dL — ABNORMAL HIGH (ref 8–23)
CO2: 22 mmol/L (ref 22–32)
Calcium: 8.2 mg/dL — ABNORMAL LOW (ref 8.9–10.3)
Chloride: 109 mmol/L (ref 98–111)
Creatinine, Ser: 2.1 mg/dL — ABNORMAL HIGH (ref 0.61–1.24)
GFR, Estimated: 32 mL/min — ABNORMAL LOW (ref >60)
Glucose, Bld: 185 mg/dL — ABNORMAL HIGH (ref 70–99)
Potassium: 3.9 mmol/L (ref 3.5–5.1)
Sodium: 141 mmol/L (ref 135–145)

## 2022-10-15 LAB — URINALYSIS, ROUTINE W REFLEX MICROSCOPIC
Bilirubin Urine: NEGATIVE
Glucose, UA: NEGATIVE mg/dL
Ketones, ur: NEGATIVE mg/dL
Leukocytes,Ua: NEGATIVE
Nitrite: NEGATIVE
Protein, ur: 100 mg/dL — AB
Specific Gravity, Urine: 1.017 (ref 1.005–1.030)
pH: 5 (ref 5.0–8.0)

## 2022-10-15 LAB — MAGNESIUM: Magnesium: 2.1 mg/dL (ref 1.7–2.4)

## 2022-10-15 MED ORDER — DOCUSATE SODIUM 100 MG PO CAPS
100.0000 mg | ORAL_CAPSULE | Freq: Two times a day (BID) | ORAL | Status: DC
  Administered 2022-10-15 (×2): 100 mg via ORAL
  Filled 2022-10-15 (×3): qty 1

## 2022-10-15 MED ORDER — BOOST / RESOURCE BREEZE PO LIQD CUSTOM
1.0000 | Freq: Two times a day (BID) | ORAL | Status: DC
  Administered 2022-10-15: 1 via ORAL

## 2022-10-15 MED ORDER — METHOCARBAMOL 500 MG PO TABS
500.0000 mg | ORAL_TABLET | Freq: Four times a day (QID) | ORAL | Status: DC
  Administered 2022-10-15 – 2022-10-16 (×4): 500 mg via ORAL
  Filled 2022-10-15 (×4): qty 1

## 2022-10-15 NOTE — Progress Notes (Signed)
Patient ID: Jacob Guerrero, male   DOB: 1945-12-24, 77 y.o.   MRN: [id number] [facility] Surgery Progress Note  1 Day Post-Op  Subjective: CC-  Having more abdominal pain after surgery yesterday. Pain is worst when he coughs. Denies any n/v. No flatus or BM since surgery. Also complaining of dysuria.  Objective: Vital signs in last 24 hours: Temp:  [97.7 F (36.5 C)-99.4 F (37.4 C)] 97.8 F (36.6 C) (02/29 0531) Pulse Rate:  [70-96] 86 (02/29 0531) Resp:  [12-20] 18 (02/29 0531) BP: (118-153)/(55-92) 118/64 (02/29 0531) SpO2:  [92 %-100 %] 97 % (02/29 0803) Arterial Line BP: (184-186)/(66-70) 186/70 (02/28 1515) Weight:  [92.5 kg] 92.5 kg (02/28 1211) Last BM Date : 10/14/22  Intake/Output from previous day: 02/28 0701 - 02/29 0700 In: 3802.7 [P.O.:360; I.V.:2842.5; IV Piggyback:600.2] Out: 875 [Urine:825; Blood:50] Intake/Output this shift: Total I/O In: -  Out: 150 [Urine:150]  PE: Gen:  Alert, NAD, pleasant Abd: soft, distended, few bowel sounds present, appropriately tender, incisions cdi without erythema or drainage  Lab Results:  Recent Labs    10/14/22 0438 10/15/22 0437  WBC 11.6* 16.2*  HGB 10.7* 10.8*  HCT 33.2* 33.3*  PLT 176 160   BMET Recent Labs    10/14/22 0904 10/15/22 0437  NA 144 141  K 3.4* 3.9  CL 110 109  CO2 24 22  GLUCOSE 178* 185*  BUN 24* 24*  CREATININE 2.05* 2.10*  CALCIUM 8.8* 8.2*   PT/INR No results for input(s): "LABPROT", "INR" in the last 72 hours. CMP     Component Value Date/Time   NA 141 10/15/2022 0437   K 3.9 10/15/2022 0437   CL 109 10/15/2022 0437   CO2 22 10/15/2022 0437   GLUCOSE 185 (H) 10/15/2022 0437   BUN 24 (H) 10/15/2022 0437   CREATININE 2.10 (H) 10/15/2022 0437   CALCIUM 8.2 (L) 10/15/2022 0437   PROT 6.5 10/14/2022 0904   ALBUMIN 3.2 (L) 10/14/2022 0904   AST 11 (L) 10/14/2022 0904   ALT 8 10/14/2022 0904   ALKPHOS 45 10/14/2022 0904   BILITOT 0.6 10/14/2022 0904   GFRNONAA 32  (L) 10/15/2022 0437   Lipase     Component Value Date/Time   LIPASE 50 10/09/2022 1303       Studies/Results: DG Abd 1 View  Result Date: 10/14/2022 CLINICAL DATA:  Abdominal pain. EXAM: ABDOMEN - 1 VIEW COMPARISON:  October 13, 2022. FINDINGS: Increased small bowel dilatation is noted concerning for distal small bowel obstruction. No colonic dilatation is noted. IMPRESSION: Increased small bowel dilatation is noted concerning for distal small bowel obstruction. Electronically Signed   By: Marijo Conception M.D.   On: 10/14/2022 08:15    Anti-infectives: Anti-infectives (From admission, onward)    Start     Dose/Rate Route Frequency Ordered Stop   10/14/22 1153  ceFAZolin (ANCEF) IVPB 2g/100 mL premix        2 g 200 mL/hr over 30 Minutes Intravenous On call to O.R. 10/14/22 1148 10/14/22 1326        Assessment/Plan SBO, small bowel mass -POD#1 s/p Diagnostic laparoscopy, laparoscopic-assisted small bowel resection 2/28 Dr. [name] - path pending - ok for clear liquids, await return in bowel function prior to advancing further - mobilize - schedule tylenol and robaxin for improved pain control - check u/a given dysuria  ID - ancef periop FEN - IVF, CLD VTE - SCDs, sq heparin Foley - none    LOS: 6 days  [place], [facility] 10/15/2022, 9:49 AM Please see Amion for pager number during day hours 7:00am-4:30pm

## 2022-10-15 NOTE — Progress Notes (Addendum)
PROGRESS NOTE    Jacob Guerrero  [id number] DOB: 06/16/1946 DOA: 10/09/2022 PCP: Pcp, No    Chief Complaint  Patient presents with   Abdominal Pain    Brief Narrative:  Jacob Guerrero is a 77 y.o. male with medical history significant of CKD, DM2, HTN, Gout, Chronic bronchitis, GERD, glaucoma, RBBB, CAD with CABG and pacemaker, BPH, anemia, OA, HLD who presents for abdominal pain and found to have an SBO.  Patient was admitted to the floor started NG tube IV fluids and n.p.o. status.  NG tube came out 2 days ago overnight.  He is now not able to tolerate a diet due to abdominal pain.  He is nauseous surgery has been reconsulted.  Patient seen again by general surgery on 10/14/2022 who are recommending diagnostic laparoscopy and possible laparotomy as KUB done 10/14/2022 with increased small bowel dilatation consistent with obstruction.   Assessment & Plan:   Principal Problem:   SBO (small bowel obstruction) (HCC) Active Problems:   BPH (benign prostatic hyperplasia)   CAD (coronary artery disease)   CKD (chronic kidney disease) stage 3, GFR 30-59 ml/min (HCC)   Diabetes mellitus (HCC)   Essential hypertension   GERD (gastroesophageal reflux disease)   Gout   HLD (hyperlipidemia)  #1 partial small bowel obstruction secondary to small bowel mass per Diagnostic laparoscopy, laparoscopic assisted small bowel resection 10/14/2022  -Patient noted to have presented with abdominal pain, some nausea and vomiting CT abdomen and pelvis done concerning for small bowel obstruction with transition point in the right abdomen, short segment of wall thickening of the small bowel at the transition point. -KUB 10/13/2022 with increased number of moderately dilated small bowel loops favor progressive small bowel obstruction. -Patient was downgraded to clear liquids on 10/13/2022. -Patient initially seen by general surgery, patient improved clinically and then noted to have passed the small  bowel obstruction protocol with contrast noted in the colon. -Patient however noted to have minimal flatus, some loose stool minimal, ongoing pain, some slight increase in abdominal distention. -KUB done on the 04/06/2023 with increased small bowel dilatation concerning for distal small bowel obstruction. -Patient reassessed by general surgery who reviewed KUB films and recommended diagnostic laparoscopy and possible laparotomy which patient underwent on 10/14/2022 which showed a small bowel mass and patient underwent laparoscopic assisted small bowel resection.  Biopsies pending. -Patient with no movement, no flatus. -Patient started on clear liquids per general surgery while awaiting return of bowel function. -Mobilize. -Supportive care. -Per general surgery.  2.  AKI on CKD stage IIIb with metabolic acidosis -Likely secondary to prerenal azotemia in the setting of diuretics, secondary to SBO. -Patient hydrated with IV fluids with improving renal function, creatinine currently at 2.10. -Continue gentle hydration.  3.  Hypokalemia/hypomagnesemia -Repleted.   -Potassium at 3.9, magnesium at 2.1.   -Repeat labs in the AM.    4.  Type 2 diabetes mellitus -Hemoglobin A1c 7.1 (2/23/ 2024). -CBG 148 this morning.   -Continue to hold oral hypoglycemic agents.   -SSI.    5.  GERD -Continue IV PPI.  6.  BPH -Hold home meds.  7.  History of gout -Continue to hold allopurinol once bowels are moving and tolerating solid diet will resume.    8.  Hyperlipidemia -Continue to hold statin and resume on discharge.  9.  CAD -Coreg and Entresto and Lasix on hold while awaiting bowels to move. -Continue IV beta-blocker.  10.  Dysuria -Patient with complaints of dysuria postoperatively. -Urinalysis ordered  per general surgery and pending.   DVT prophylaxis: Heparin Code Status: Full Family Communication: Updated patient.  No family at bedside. Disposition: Home when clinically improved and  cleared by general surgery.  Status is: Inpatient Remains inpatient appropriate because: Severity of illness   Consultants:  General surgery: Dr. Barry Dienes 10/09/2022  Procedures:  CT abdomen and pelvis 10/09/2022 Abdominal films 10/11/2022, 10/12/2022, 10/13/2022, 10/14/2022 Small bowel obstruction protocol Diagnostic laparoscopy, laparoscopic assisted small bowel resection per general surgery: Dr. [name] 10/14/2022  Antimicrobials:  Anti-infectives (From admission, onward)    Start     Dose/Rate Route Frequency Ordered Stop   10/14/22 1153  ceFAZolin (ANCEF) IVPB 2g/100 mL premix        2 g 200 mL/hr over 30 Minutes Intravenous On call to O.R. 10/14/22 1148 10/14/22 1326         Subjective: Patient laying in bed.  Still with some abdominal discomfort when he coughs.  Complaining of dysuria.  No chest pain.  No shortness of breath.  No flatus.  No bowel movement.  Asking how long he is going to be in the hospital for.   Objective: Vitals:   10/15/22 0502 10/15/22 0531 10/15/22 0803 10/15/22 1005  BP: (!) 125/55 118/64  125/64  Pulse: 96 86  100  Resp: '17 18  16  '$ Temp: 98.5 F (36.9 C) 97.8 F (36.6 C)  98.3 F (36.8 C)  TempSrc: Oral Oral  Oral  SpO2: 93% 92% 97% 95%  Weight:      Height:        Intake/Output Summary (Last 24 hours) at 10/15/2022 1236 Last data filed at 10/15/2022 1000 Gross per 24 hour  Intake 3922.74 ml  Output 1175 ml  Net 2747.74 ml    Filed Weights   10/09/22 1224 10/14/22 1211  Weight: 92.5 kg 92.5 kg    Examination:  General exam: Appears calm and comfortable  Respiratory system: Clear to auscultation anterior lung fields.  No wheezes, no crackles, no rhonchi.  Fair air movement.  Speaking in full sentences.Marland Kitchen Respiratory effort normal. Cardiovascular system: S1 & S2 heard, RRR. No JVD, murmurs, rubs, gallops or clicks. No pedal edema. Gastrointestinal system: Abdomen mildly distended, hypoactive bowel sounds, some diffuse tenderness to  palpation more in the right lower quadrant.  Incision sites c/d/I.  Central nervous system: Alert and oriented. No focal neurological deficits. Extremities: Symmetric 5 x 5 power. Skin: No rashes, lesions or ulcers Psychiatry: Judgement and insight appear normal. Mood & affect appropriate.     Data Reviewed: I have personally reviewed following labs and imaging studies  CBC: Recent Labs  Lab 10/09/22 1303 10/09/22 1814 10/10/22 0534 10/11/22 0817 10/13/22 0502 10/14/22 0438 10/15/22 0437  WBC 15.6*   < > 7.8 9.0 9.7 11.6* 16.2*  NEUTROABS 14.1*  --   --   --   --   --  12.7*  HGB 12.3*   < > 11.3* 10.5* 10.5* 10.7* 10.8*  HCT 37.3*   < > 34.3* 33.0* 33.0* 33.2* 33.3*  MCV 91.9   < > 90.3 92.4 93.5 93.8 93.0  PLT 241   < > 198 169 169 176 160   < > = values in this interval not displayed.     Basic Metabolic Panel: Recent Labs  Lab 10/11/22 0817 10/13/22 0502 10/13/22 1414 10/14/22 0904 10/15/22 0437  NA 139 145 141 144 141  K 4.4 4.4 3.5 3.4* 3.9  CL 104 112* 110 110 109  CO2 23 24 23  24 22  GLUCOSE 178* 182* 204* 178* 185*  BUN 58* 32* 27* 24* 24*  CREATININE 2.93* 2.24* 2.09* 2.05* 2.10*  CALCIUM 9.2 9.4 8.9 8.8* 8.2*  MG  --   --   --  1.6* 2.1     GFR: Estimated Creatinine Clearance: 36.2 mL/min (A) (by C-G formula based on SCr of 2.1 mg/dL (H)).  Liver Function Tests: Recent Labs  Lab 10/09/22 1303 10/11/22 0817 10/13/22 1414 10/14/22 0904  AST 24 15 11* 11*  ALT '12 11 8 8  '$ ALKPHOS 59 43 44 45  BILITOT 0.6 0.6 0.6 0.6  PROT 9.0* 6.6 6.5 6.5  ALBUMIN 4.6 3.5 3.2* 3.2*     CBG: Recent Labs  Lab 10/14/22 2350 10/15/22 0350 10/15/22 0737 10/15/22 0954 10/15/22 1134  GLUCAP 199* 169* 172* 148* 187*      Recent Results (from the past 240 hour(s))  Resp panel by RT-PCR (RSV, Flu A&B, Covid) Anterior Nasal Swab     Status: None   Collection Time: 10/09/22  1:14 PM   Specimen: Anterior Nasal Swab  Result Value Ref Range Status   SARS  Coronavirus 2 by RT PCR NEGATIVE NEGATIVE Final    Comment: (NOTE) SARS-CoV-2 target nucleic acids are NOT DETECTED.  The SARS-CoV-2 RNA is generally detectable in upper respiratory specimens during the acute phase of infection. The lowest concentration of SARS-CoV-2 viral copies this assay can detect is 138 copies/mL. A negative result does not preclude SARS-Cov-2 infection and should not be used as the sole basis for treatment or other patient management decisions. A negative result may occur with  improper specimen collection/handling, submission of specimen other than nasopharyngeal swab, presence of viral mutation(s) within the areas targeted by this assay, and inadequate number of viral copies(<138 copies/mL). A negative result must be combined with clinical observations, patient history, and epidemiological information. The expected result is Negative.  Fact Sheet for Patients:  EntrepreneurPulse.com.au  Fact Sheet for Healthcare Providers:  IncredibleEmployment.be  This test is no t yet approved or cleared by the [place] FDA and  has been authorized for detection and/or diagnosis of SARS-CoV-2 by FDA under an Emergency Use Authorization (EUA). This EUA will remain  in effect (meaning this test can be used) for the duration of the COVID-19 declaration under Section 564(b)(1) of the Act, 21 U.S.C.section 360bbb-3(b)(1), unless the authorization is terminated  or revoked sooner.       Influenza A by PCR NEGATIVE NEGATIVE Final   Influenza B by PCR NEGATIVE NEGATIVE Final    Comment: (NOTE) The Xpert Xpress SARS-CoV-2/FLU/RSV plus assay is intended as an aid in the diagnosis of influenza from Nasopharyngeal swab specimens and should not be used as a sole basis for treatment. Nasal washings and aspirates are unacceptable for Xpert Xpress SARS-CoV-2/FLU/RSV testing.  Fact Sheet for  Patients: EntrepreneurPulse.com.au  Fact Sheet for Healthcare Providers: IncredibleEmployment.be  This test is not yet approved or cleared by the [place] FDA and has been authorized for detection and/or diagnosis of SARS-CoV-2 by FDA under an Emergency Use Authorization (EUA). This EUA will remain in effect (meaning this test can be used) for the duration of the COVID-19 declaration under Section 564(b)(1) of the Act, 21 U.S.C. section 360bbb-3(b)(1), unless the authorization is terminated or revoked.     Resp Syncytial Virus by PCR NEGATIVE NEGATIVE Final    Comment: (NOTE) Fact Sheet for Patients: EntrepreneurPulse.com.au  Fact Sheet for Healthcare Providers: IncredibleEmployment.be  This test is not yet approved or  cleared by the [place] and has been authorized for detection and/or diagnosis of SARS-CoV-2 by FDA under an Emergency Use Authorization (EUA). This EUA will remain in effect (meaning this test can be used) for the duration of the COVID-19 declaration under Section 564(b)(1) of the Act, 21 U.S.C. section 360bbb-3(b)(1), unless the authorization is terminated or revoked.  Performed at [facility], [place] [address], [place], [place] [address]          Radiology Studies: DG Abd 1 View  Result Date: 10/14/2022 CLINICAL DATA:  Abdominal pain. EXAM: ABDOMEN - 1 VIEW COMPARISON:  October 13, 2022. FINDINGS: Increased small bowel dilatation is noted concerning for distal small bowel obstruction. No colonic dilatation is noted. IMPRESSION: Increased small bowel dilatation is noted concerning for distal small bowel obstruction. Electronically Signed   By: Marijo Conception M.D.   On: 10/14/2022 08:15        Scheduled Meds:  acetaminophen  650 mg Oral Q6H   bisacodyl  10 mg Rectal Once   budesonide  0.25 mg Nebulization BID   docusate sodium  100 mg Oral BID    dorzolamide  1 drop Both Eyes TID   feeding supplement  1 Container Oral BID BM   heparin injection (subcutaneous)  5,000 Units Subcutaneous Q8H   insulin aspart  0-15 Units Subcutaneous Q4H   methocarbamol  500 mg Oral Q6H   metoprolol tartrate  2.5 mg Intravenous Q8H   Netarsudil-Latanoprost  1 drop Both Eyes QHS   pantoprazole (PROTONIX) IV  40 mg Intravenous Q24H   sodium chloride flush  3 mL Intravenous Q12H   Continuous Infusions:  lactated ringers 75 mL/hr at 10/15/22 1222     LOS: 6 days    Time spent: 35 minutes    Irine Seal, MD [facility]   To contact the attending provider between 7A-7P or the covering provider during after hours 7P-7A, please log into the web site [email or web address] and access using universal [place] password for that web site. If you do not have the password, please call the hospital operator.  10/15/2022, 12:36 PM

## 2022-10-15 NOTE — Progress Notes (Signed)
Mobility Specialist - Progress Note   10/15/22 0956  Oxygen Therapy  O2 Device Room Air  O2 Flow Rate (L/min) 91 L/min  Patient Activity (if Appropriate) Ambulating  Mobility  Activity Ambulated with assistance in hallway  Level of Assistance Standby assist, set-up cues, supervision of patient - no hands on  Assistive Device Front wheel walker  Distance Ambulated (ft) 230 ft  Activity Response Tolerated well  Mobility Referral Yes  $Mobility charge 1 Mobility   Pt received in bed and agreeable to mobility. Prior to ambulating, pt c/o abdomen pain rating it an 8/10. Pt was MinA for bed mobility & from sit-to-stand. Pt still eager to ambulate despite being in pain. After ambulating 147f, pt started shaking & had some SOB. No other complaints during session. Nurse made aware of occurences. Pt to bed after session with all needs met & nurse in room.   Post-mobility: 123 HR, 91% SPO2  MSet designer

## 2022-10-15 NOTE — Discharge Instructions (Signed)
[facility]      [facility] °[phone] ° °OPEN ABDOMINAL SURGERY: POST OP INSTRUCTIONS ° °Always review your discharge instruction sheet given to you by the facility where your surgery was performed. ° °IF YOU HAVE DISABILITY OR FAMILY LEAVE FORMS, YOU MUST BRING THEM TO THE OFFICE FOR PROCESSING.  PLEASE DO NOT GIVE THEM TO YOUR DOCTOR. ° °A prescription for pain medication may be given to you upon discharge.  Take your pain medication as prescribed, if needed.  If narcotic pain medicine is not needed, then you may take acetaminophen (Tylenol) or ibuprofen (Advil) as needed. °Take your usually prescribed medications unless otherwise directed. °If you need a refill on your pain medication, please contact your pharmacy. They will contact our office to request authorization.  Prescriptions will not be filled after 5pm or on week-ends. °You should follow a light diet the first few days after arrival home, such as soup and crackers, pudding, etc.unless your doctor has advised otherwise. A high-fiber, low fat diet can be resumed as tolerated.   Be sure to include lots of fluids daily. Most patients will experience some swelling and bruising on the chest and neck area.  Ice packs will help.  Swelling and bruising can take several days to resolve °Most patients will experience some swelling and bruising in the area of the incision. Ice pack will help. Swelling and bruising can take several days to resolve..  °It is common to experience some constipation if taking pain medication after surgery.  Increasing fluid intake and taking a stool softener will usually help or prevent this problem from occurring.  A mild laxative (Milk of Magnesia or Miralax) should be taken according to package directions if there are no bowel movements after 48 hours. ° You may have steri-strips (small skin tapes) in place directly over the incision.  These strips should be left on the skin for 7-10 days.  If your surgeon used skin  glue on the incision, you may shower in 24 hours.  The glue will flake off over the next 2-3 weeks.  Any sutures or staples will be removed at the office during your follow-up visit. You may find that a light gauze bandage over your incision may keep your staples from being rubbed or pulled. You may shower and replace the bandage daily. °ACTIVITIES:  You may resume regular (light) daily activities beginning the next day--such as daily self-care, walking, climbing stairs--gradually increasing activities as tolerated.  You may have sexual intercourse when it is comfortable.  Refrain from any heavy lifting or straining until approved by your doctor. °You may drive when you no longer are taking prescription pain medication, you can comfortably wear a seatbelt, and you can safely maneuver your car and apply brakes ° °You should see your doctor in the office for a follow-up appointment approximately two weeks after your surgery.  Make sure that you call for this appointment within a day or two after you arrive home to insure a convenient appointment time. ° °WHEN TO CALL YOUR DOCTOR: °Fever over 101.0 °Inability to urinate °Nausea and/or vomiting °Extreme swelling or bruising °Continued bleeding from incision. °Increased pain, redness, or drainage from the incision. °Difficulty swallowing or breathing °Muscle cramping or spasms. °Numbness or tingling in hands or feet or around lips. ° °The clinic staff is available to answer your questions during regular business hours.  Please don’t hesitate to call and ask to speak to one of the nurses if you have concerns. ° °For   further questions, please visit [email or web address]  °

## 2022-10-16 ENCOUNTER — Inpatient Hospital Stay (HOSPITAL_COMMUNITY): Payer: Medicare Other

## 2022-10-16 DIAGNOSIS — I2585 Chronic coronary microvascular dysfunction: Secondary | ICD-10-CM | POA: Diagnosis not present

## 2022-10-16 DIAGNOSIS — N400 Enlarged prostate without lower urinary tract symptoms: Secondary | ICD-10-CM | POA: Diagnosis not present

## 2022-10-16 DIAGNOSIS — I10 Essential (primary) hypertension: Secondary | ICD-10-CM | POA: Diagnosis not present

## 2022-10-16 DIAGNOSIS — K56609 Unspecified intestinal obstruction, unspecified as to partial versus complete obstruction: Secondary | ICD-10-CM | POA: Diagnosis not present

## 2022-10-16 LAB — BASIC METABOLIC PANEL
Anion gap: 13 (ref 5–15)
BUN: 28 mg/dL — ABNORMAL HIGH (ref 8–23)
CO2: 19 mmol/L — ABNORMAL LOW (ref 22–32)
Calcium: 8.3 mg/dL — ABNORMAL LOW (ref 8.9–10.3)
Chloride: 108 mmol/L (ref 98–111)
Creatinine, Ser: 2.39 mg/dL — ABNORMAL HIGH (ref 0.61–1.24)
GFR, Estimated: 27 mL/min — ABNORMAL LOW (ref >60)
Glucose, Bld: 199 mg/dL — ABNORMAL HIGH (ref 70–99)
Potassium: 3.4 mmol/L — ABNORMAL LOW (ref 3.5–5.1)
Sodium: 140 mmol/L (ref 135–145)

## 2022-10-16 LAB — CBC
HCT: 32.1 % — ABNORMAL LOW (ref 39.0–52.0)
Hemoglobin: 10.3 g/dL — ABNORMAL LOW (ref 13.0–17.0)
MCH: 30.2 pg (ref 26.0–34.0)
MCHC: 32.1 g/dL (ref 30.0–36.0)
MCV: 94.1 fL (ref 80.0–100.0)
Platelets: 158 10*3/uL (ref 150–400)
RBC: 3.41 MIL/uL — ABNORMAL LOW (ref 4.22–5.81)
RDW: 14.7 % (ref 11.5–15.5)
WBC: 22.4 10*3/uL — ABNORMAL HIGH (ref 4.0–10.5)
nRBC: 0 % (ref 0.0–0.2)

## 2022-10-16 LAB — GLUCOSE, CAPILLARY
Glucose-Capillary: 145 mg/dL — ABNORMAL HIGH (ref 70–99)
Glucose-Capillary: 164 mg/dL — ABNORMAL HIGH (ref 70–99)
Glucose-Capillary: 180 mg/dL — ABNORMAL HIGH (ref 70–99)
Glucose-Capillary: 190 mg/dL — ABNORMAL HIGH (ref 70–99)
Glucose-Capillary: 196 mg/dL — ABNORMAL HIGH (ref 70–99)
Glucose-Capillary: 201 mg/dL — ABNORMAL HIGH (ref 70–99)
Glucose-Capillary: 204 mg/dL — ABNORMAL HIGH (ref 70–99)

## 2022-10-16 LAB — MAGNESIUM: Magnesium: 2.1 mg/dL (ref 1.7–2.4)

## 2022-10-16 MED ORDER — POTASSIUM CHLORIDE 10 MEQ/100ML IV SOLN
10.0000 meq | INTRAVENOUS | Status: AC
  Administered 2022-10-16 (×4): 10 meq via INTRAVENOUS
  Filled 2022-10-16 (×4): qty 100

## 2022-10-16 MED ORDER — HYDROMORPHONE HCL 1 MG/ML IJ SOLN
0.5000 mg | INTRAMUSCULAR | Status: DC | PRN
  Administered 2022-10-16 – 2022-10-17 (×2): 1 mg via INTRAVENOUS
  Filled 2022-10-16 (×2): qty 1

## 2022-10-16 MED ORDER — LIDOCAINE 5 % EX PTCH
1.0000 | MEDICATED_PATCH | CUTANEOUS | Status: DC
  Administered 2022-10-16 – 2022-10-20 (×4): 1 via TRANSDERMAL
  Filled 2022-10-16 (×5): qty 1

## 2022-10-16 MED ORDER — SODIUM CHLORIDE 0.9 % IV SOLN: INTRAVENOUS | Status: DC | PRN

## 2022-10-16 NOTE — Progress Notes (Signed)
PROGRESS NOTE    Jacob Guerrero  [id number] DOB: [birth year] DOA: 10/09/2022 PCP: Pcp, No    Chief Complaint  Patient presents with   Abdominal Pain    Brief Narrative:  Jacob Guerrero is a 77 y.o. male with medical history significant of CKD, DM2, HTN, Gout, Chronic bronchitis, GERD, glaucoma, RBBB, CAD with CABG and pacemaker, BPH, anemia, OA, HLD who presents for abdominal pain and found to have an SBO.  Patient was admitted to the floor started NG tube IV fluids and n.p.o. status.  NG tube came out 2 days ago overnight.  He is now not able to tolerate a diet due to abdominal pain.  He is nauseous surgery has been reconsulted.  Patient seen again by general surgery on 10/14/2022 who are recommending diagnostic laparoscopy and possible laparotomy as KUB done 10/14/2022 with increased small bowel dilatation consistent with obstruction.   Assessment & Plan:   Principal Problem:   SBO (small bowel obstruction) (HCC) Active Problems:   BPH (benign prostatic hyperplasia)   CAD (coronary artery disease)   CKD (chronic kidney disease) stage 3, GFR 30-59 ml/min (HCC)   Diabetes mellitus (HCC)   Essential hypertension   GERD (gastroesophageal reflux disease)   Gout   HLD (hyperlipidemia)  #1 partial small bowel obstruction secondary to small bowel mass per Diagnostic laparoscopy, laparoscopic assisted small bowel resection 10/14/2022  -Patient noted to have presented with abdominal pain, some nausea and vomiting CT abdomen and pelvis done concerning for small bowel obstruction with transition point in the right abdomen, short segment of wall thickening of the small bowel at the transition point. -KUB 10/13/2022 with increased number of moderately dilated small bowel loops favor progressive small bowel obstruction. -Patient was downgraded to clear liquids on 10/13/2022. -Patient initially seen by general surgery, patient improved clinically and then noted to have passed the small  bowel obstruction protocol with contrast noted in the colon. -Patient however noted to have minimal flatus, some loose stool minimal, ongoing pain, some slight increase in abdominal distention. -KUB done on the 04/06/2023 with increased small bowel dilatation concerning for distal small bowel obstruction. -Patient reassessed by general surgery who reviewed KUB films and recommended diagnostic laparoscopy and possible laparotomy which patient underwent on 10/14/2022 which showed a small bowel mass and patient underwent laparoscopic assisted small bowel resection.  Biopsies pending. -Patient with emesis this morning, abdominal distention, diffuse abdominal pain, concern for postop ileus. -Patient noted to have bowel movement this morning which was a mixture between loose stools or mushy stools. -KUB ordered per general surgery this morning concerning for postop ileus. -Patient made n.p.o. per general surgery. -Patient hoping not to have NG tube reinserted. -Mobilize. -Supportive care. -Per general surgery.  2.  AKI on CKD stage IIIb with metabolic acidosis -Likely secondary to prerenal azotemia in the setting of diuretics, secondary to SBO. -Patient hydrated with IV fluids with improving renal function, creatinine currently at 2.39. -Patient noted to have some episodes of emesis this morning. -Increase IV fluids to 100 cc an hour.  3.  Hypokalemia/hypomagnesemia -Potassium at 3.4, magnesium at 2.1. -KCl 10 mEq IV every hour x 4 runs. -Repeat labs in the AM.    4.  Type 2 diabetes mellitus -Hemoglobin A1c 7.1 (2/23/ 2024). -Blood glucose level at 199 on morning labs.  -SSI.    5.  GERD -IV PPI.    6.  BPH -Hold home meds.  7.  History of gout -Allopurinol on hold.   -Likely resume  once tolerating solid diet.   8.  Hyperlipidemia -Continue to hold statin and resume on discharge.  9.  CAD -Coreg and Entresto and Lasix on hold while awaiting bowels to move. -Continue IV  beta-blocker.  10.  Dysuria -Patient with complaints of dysuria postoperatively. -Urinalysis unremarkable.   -Supportive care.     DVT prophylaxis: Heparin Code Status: Full Family Communication: Updated patient.  No family at bedside. Disposition: Home when clinically improved and cleared by general surgery.  Status is: Inpatient Remains inpatient appropriate because: Severity of illness   Consultants:  General surgery: Dr. Barry Dienes 10/09/2022  Procedures:  CT abdomen and pelvis 10/09/2022 Abdominal films 10/11/2022, 10/12/2022, 10/13/2022, 10/14/2022, 10/16/2022 Small bowel obstruction protocol Diagnostic laparoscopy, laparoscopic assisted small bowel resection per general surgery: Dr. [name] 10/14/2022  Antimicrobials:  Anti-infectives (From admission, onward)    Start     Dose/Rate Route Frequency Ordered Stop   10/14/22 1153  ceFAZolin (ANCEF) IVPB 2g/100 mL premix        2 g 200 mL/hr over 30 Minutes Intravenous On call to O.R. 10/14/22 1148 10/14/22 1326         Subjective: Patient laying in bed.  Complains of abdominal distention.  Had an episode of emesis.  Stated had a bowel movement which was a mixture between watery and mushy stool with a bad smell.  Denies any chest pain or shortness of breath.  Hoping not to have NG tube placed.   Objective: Vitals:   10/15/22 2035 10/16/22 0026 10/16/22 0411 10/16/22 1128  BP: (!) 156/62 106/84 137/87 126/63  Pulse: 80 (!) 106 88 85  Resp: '18 18 14 14  '$ Temp: 99.6 F (37.6 C) 100.2 F (37.9 C) 99.5 F (37.5 C) (!) 97.5 F (36.4 C)  TempSrc: Oral Oral Oral Oral  SpO2: 94% 94% 97% 96%  Weight:      Height:        Intake/Output Summary (Last 24 hours) at 10/16/2022 1323 Last data filed at 10/16/2022 0615 Gross per 24 hour  Intake 2449.79 ml  Output 650 ml  Net 1799.79 ml    Filed Weights   10/09/22 1224 10/14/22 1211  Weight: 92.5 kg 92.5 kg    Examination:  General exam: NAD. Respiratory system: CTAB.  No wheezes,  no crackles, no rhonchi.  Fair air movement.  Speaking in full sentences.  Normal respiratory effort.   Cardiovascular system: Regular rate rhythm no murmurs rubs or gallops.  No JVD.  No lower extremity edema.  Gastrointestinal system: Abdomen is soft, distended, hypoactive bowel sounds, diffusely tender to palpation.  Incision sites c/d/I.  Central nervous system: Alert and oriented.  Moving extremities spontaneously.  No focal neurological deficits.  Extremities: Symmetric 5 x 5 power. Skin: No rashes, lesions or ulcers Psychiatry: Judgement and insight appear normal. Mood & affect appropriate.     Data Reviewed: I have personally reviewed following labs and imaging studies  CBC: Recent Labs  Lab 10/11/22 0817 10/13/22 0502 10/14/22 0438 10/15/22 0437 10/16/22 0527  WBC 9.0 9.7 11.6* 16.2* 22.4*  NEUTROABS  --   --   --  12.7*  --   HGB 10.5* 10.5* 10.7* 10.8* 10.3*  HCT 33.0* 33.0* 33.2* 33.3* 32.1*  MCV 92.4 93.5 93.8 93.0 94.1  PLT 169 169 176 160 158     Basic Metabolic Panel: Recent Labs  Lab 10/13/22 0502 10/13/22 1414 10/14/22 0904 10/15/22 0437 10/16/22 0527  NA 145 141 144 141 140  K 4.4 3.5 3.4* 3.9 3.4*  CL 112* 110 110 109 108  CO2 '24 23 24 22 '$ 19*  GLUCOSE 182* 204* 178* 185* 199*  BUN 32* 27* 24* 24* 28*  CREATININE 2.24* 2.09* 2.05* 2.10* 2.39*  CALCIUM 9.4 8.9 8.8* 8.2* 8.3*  MG  --   --  1.6* 2.1 2.1     GFR: Estimated Creatinine Clearance: 31.8 mL/min (A) (by C-G formula based on SCr of 2.39 mg/dL (H)).  Liver Function Tests: Recent Labs  Lab 10/11/22 0817 10/13/22 1414 10/14/22 0904  AST 15 11* 11*  ALT '11 8 8  '$ ALKPHOS 43 44 45  BILITOT 0.6 0.6 0.6  PROT 6.6 6.5 6.5  ALBUMIN 3.5 3.2* 3.2*     CBG: Recent Labs  Lab 10/15/22 1953 10/16/22 0027 10/16/22 0409 10/16/22 0736 10/16/22 1127  GLUCAP 150* 145* 180* 164* 201*      Recent Results (from the past 240 hour(s))  Resp panel by RT-PCR (RSV, Flu A&B, Covid) Anterior  Nasal Swab     Status: None   Collection Time: 10/09/22  1:14 PM   Specimen: Anterior Nasal Swab  Result Value Ref Range Status   SARS Coronavirus 2 by RT PCR NEGATIVE NEGATIVE Final    Comment: (NOTE) SARS-CoV-2 target nucleic acids are NOT DETECTED.  The SARS-CoV-2 RNA is generally detectable in upper respiratory specimens during the acute phase of infection. The lowest concentration of SARS-CoV-2 viral copies this assay can detect is 138 copies/mL. A negative result does not preclude SARS-Cov-2 infection and should not be used as the sole basis for treatment or other patient management decisions. A negative result may occur with  improper specimen collection/handling, submission of specimen other than nasopharyngeal swab, presence of viral mutation(s) within the areas targeted by this assay, and inadequate number of viral copies(<138 copies/mL). A negative result must be combined with clinical observations, patient history, and epidemiological information. The expected result is Negative.  Fact Sheet for Patients:  EntrepreneurPulse.com.au  Fact Sheet for Healthcare Providers:  IncredibleEmployment.be  This test is no t yet approved or cleared by the [place] FDA and  has been authorized for detection and/or diagnosis of SARS-CoV-2 by FDA under an Emergency Use Authorization (EUA). This EUA will remain  in effect (meaning this test can be used) for the duration of the COVID-19 declaration under Section 564(b)(1) of the Act, 21 U.S.C.section 360bbb-3(b)(1), unless the authorization is terminated  or revoked sooner.       Influenza A by PCR NEGATIVE NEGATIVE Final   Influenza B by PCR NEGATIVE NEGATIVE Final    Comment: (NOTE) The Xpert Xpress SARS-CoV-2/FLU/RSV plus assay is intended as an aid in the diagnosis of influenza from Nasopharyngeal swab specimens and should not be used as a sole basis for treatment. Nasal washings  and aspirates are unacceptable for Xpert Xpress SARS-CoV-2/FLU/RSV testing.  Fact Sheet for Patients: EntrepreneurPulse.com.au  Fact Sheet for Healthcare Providers: IncredibleEmployment.be  This test is not yet approved or cleared by the [place] FDA and has been authorized for detection and/or diagnosis of SARS-CoV-2 by FDA under an Emergency Use Authorization (EUA). This EUA will remain in effect (meaning this test can be used) for the duration of the COVID-19 declaration under Section 564(b)(1) of the Act, 21 U.S.C. section 360bbb-3(b)(1), unless the authorization is terminated or revoked.     Resp Syncytial Virus by PCR NEGATIVE NEGATIVE Final    Comment: (NOTE) Fact Sheet for Patients: EntrepreneurPulse.com.au  Fact Sheet for Healthcare Providers: IncredibleEmployment.be  This test is not yet approved  or cleared by the [place] and has been authorized for detection and/or diagnosis of SARS-CoV-2 by FDA under an Emergency Use Authorization (EUA). This EUA will remain in effect (meaning this test can be used) for the duration of the COVID-19 declaration under Section 564(b)(1) of the Act, 21 U.S.C. section 360bbb-3(b)(1), unless the authorization is terminated or revoked.  Performed at [facility], [place] [address], [place], [place] [address]          Radiology Studies: DG Abd Portable 1V  Result Date: 10/16/2022 CLINICAL DATA:  267752 Post-operative nausea and vomiting [id number] EXAM: PORTABLE ABDOMEN - 1 VIEW COMPARISON:  Radiograph 10/14/2022 FINDINGS: There are multiple dilated loops of small bowel, similar to prior exam. Patient underwent recent diagnostic laparoscopy with small-bowel resection on 10/14/2022. IMPRESSION: Persistent multiple dilated loops of small bowel, most likely postoperative ileus, but possibly ongoing obstruction. Recommend radiographic  follow-up. Electronically Signed   By: Maurine Simmering M.D.   On: 10/16/2022 11:39        Scheduled Meds:  acetaminophen  650 mg Oral Q6H   bisacodyl  10 mg Rectal Once   budesonide  0.25 mg Nebulization BID   docusate sodium  100 mg Oral BID   dorzolamide  1 drop Both Eyes TID   feeding supplement  1 Container Oral BID BM   heparin injection (subcutaneous)  5,000 Units Subcutaneous Q8H   insulin aspart  0-15 Units Subcutaneous Q4H   lidocaine  1 patch Transdermal Q24H   metoprolol tartrate  2.5 mg Intravenous Q8H   Netarsudil-Latanoprost  1 drop Both Eyes QHS   pantoprazole (PROTONIX) IV  40 mg Intravenous Q24H   sodium chloride flush  3 mL Intravenous Q12H   Continuous Infusions:  sodium chloride     lactated ringers 100 mL/hr at 10/16/22 0926   potassium chloride 50 mL/hr at 10/16/22 1045     LOS: 7 days    Time spent: 35 minutes    Irine Seal, MD [facility]   To contact the attending provider between 7A-7P or the covering provider during after hours 7P-7A, please log into the web site [email or web address] and access using universal [place] password for that web site. If you do not have the password, please call the hospital operator.  10/16/2022, 1:23 PM

## 2022-10-16 NOTE — Progress Notes (Addendum)
Patient ID: Jacob Guerrero, male   DOB: 21-Feb-1946, 77 y.o.   MRN: [id number] [facility] Surgery Progress Note  2 Days Post-Op  Subjective: CC-  Having more abdominal pain and distention. Started vomiting this morning as well as having incontinence of small volume loose stool.   Objective: Vital signs in last 24 hours: Temp:  [98.3 F (36.8 C)-100.2 F (37.9 C)] 99.5 F (37.5 C) (03/01 0411) Pulse Rate:  [80-106] 88 (03/01 0411) Resp:  [14-18] 14 (03/01 0411) BP: (106-156)/(62-87) 137/87 (03/01 0411) SpO2:  [91 %-97 %] 97 % (03/01 0411) Last BM Date : 10/16/22  Intake/Output from previous day: 02/29 0701 - 03/01 0700 In: 2689.8 [P.O.:720; I.V.:1969.8] Out: 1050 [Urine:1050] Intake/Output this shift: No intake/output data recorded.  PE: Gen:  Alert, appears uncomfortable Abd: soft, moderate distention, appropriately tender, vomiting during my exam.   Lab Results:  Recent Labs    10/15/22 0437 10/16/22 0527  WBC 16.2* 22.4*  HGB 10.8* 10.3*  HCT 33.3* 32.1*  PLT 160 158   BMET Recent Labs    10/15/22 0437 10/16/22 0527  NA 141 140  K 3.9 3.4*  CL 109 108  CO2 22 19*  GLUCOSE 185* 199*  BUN 24* 28*  CREATININE 2.10* 2.39*  CALCIUM 8.2* 8.3*   PT/INR No results for input(s): "LABPROT", "INR" in the last 72 hours. CMP     Component Value Date/Time   NA 140 10/16/2022 0527   K 3.4 (L) 10/16/2022 0527   CL 108 10/16/2022 0527   CO2 19 (L) 10/16/2022 0527   GLUCOSE 199 (H) 10/16/2022 0527   BUN 28 (H) 10/16/2022 0527   CREATININE 2.39 (H) 10/16/2022 0527   CALCIUM 8.3 (L) 10/16/2022 0527   PROT 6.5 10/14/2022 0904   ALBUMIN 3.2 (L) 10/14/2022 0904   AST 11 (L) 10/14/2022 0904   ALT 8 10/14/2022 0904   ALKPHOS 45 10/14/2022 0904   BILITOT 0.6 10/14/2022 0904   GFRNONAA 27 (L) 10/16/2022 0527   Lipase     Component Value Date/Time   LIPASE 50 10/09/2022 1303       Studies/Results: No results  found.  Anti-infectives: Anti-infectives (From admission, onward)    Start     Dose/Rate Route Frequency Ordered Stop   10/14/22 1153  ceFAZolin (ANCEF) IVPB 2g/100 mL premix        2 g 200 mL/hr over 30 Minutes Intravenous On call to O.R. 10/14/22 1148 10/14/22 1326        Assessment/Plan SBO, small bowel mass -POD#2 s/p Diagnostic laparoscopy, laparoscopic-assisted small bowel resection 2/28 Dr. [name] - path pending - suspect he has a post-operative ileus. Patient does not want NG tube even though I recommended one during my exam, he wants to get an x-ray first. STAT KUB to confirm,  then likely NG tube placement for decompression.  - mobilize   ID - ancef periop FEN - IVF, NPO  VTE - SCDs, sq heparin Foley - none  AKI on CKD DM2 GERD BPH  Gout  HLD CAD Dysuria    LOS: 7 days    Jill Alexanders, [facility] Surgery 10/16/2022, 9:28 AM Please see Amion for pager number during day hours 7:00am-4:30pm

## 2022-10-17 DIAGNOSIS — K567 Ileus, unspecified: Secondary | ICD-10-CM

## 2022-10-17 DIAGNOSIS — I10 Essential (primary) hypertension: Secondary | ICD-10-CM | POA: Diagnosis not present

## 2022-10-17 DIAGNOSIS — K9189 Other postprocedural complications and disorders of digestive system: Secondary | ICD-10-CM

## 2022-10-17 DIAGNOSIS — N400 Enlarged prostate without lower urinary tract symptoms: Secondary | ICD-10-CM | POA: Diagnosis not present

## 2022-10-17 DIAGNOSIS — I2585 Chronic coronary microvascular dysfunction: Secondary | ICD-10-CM | POA: Diagnosis not present

## 2022-10-17 DIAGNOSIS — K56609 Unspecified intestinal obstruction, unspecified as to partial versus complete obstruction: Secondary | ICD-10-CM | POA: Diagnosis not present

## 2022-10-17 LAB — BASIC METABOLIC PANEL
Anion gap: 12 (ref 5–15)
BUN: 29 mg/dL — ABNORMAL HIGH (ref 8–23)
CO2: 22 mmol/L (ref 22–32)
Calcium: 8.4 mg/dL — ABNORMAL LOW (ref 8.9–10.3)
Chloride: 110 mmol/L (ref 98–111)
Creatinine, Ser: 1.79 mg/dL — ABNORMAL HIGH (ref 0.61–1.24)
GFR, Estimated: 39 mL/min — ABNORMAL LOW (ref >60)
Glucose, Bld: 166 mg/dL — ABNORMAL HIGH (ref 70–99)
Potassium: 3.6 mmol/L (ref 3.5–5.1)
Sodium: 144 mmol/L (ref 135–145)

## 2022-10-17 LAB — CBC
HCT: 28.8 % — ABNORMAL LOW (ref 39.0–52.0)
Hemoglobin: 9.2 g/dL — ABNORMAL LOW (ref 13.0–17.0)
MCH: 29.6 pg (ref 26.0–34.0)
MCHC: 31.9 g/dL (ref 30.0–36.0)
MCV: 92.6 fL (ref 80.0–100.0)
Platelets: 167 10*3/uL (ref 150–400)
RBC: 3.11 MIL/uL — ABNORMAL LOW (ref 4.22–5.81)
RDW: 14.7 % (ref 11.5–15.5)
WBC: 19.2 10*3/uL — ABNORMAL HIGH (ref 4.0–10.5)
nRBC: 0.1 % (ref 0.0–0.2)

## 2022-10-17 LAB — URINE CULTURE: Culture: NO GROWTH

## 2022-10-17 LAB — GLUCOSE, CAPILLARY
Glucose-Capillary: 164 mg/dL — ABNORMAL HIGH (ref 70–99)
Glucose-Capillary: 155 mg/dL — ABNORMAL HIGH (ref 70–99)
Glucose-Capillary: 164 mg/dL — ABNORMAL HIGH (ref 70–99)
Glucose-Capillary: 181 mg/dL — ABNORMAL HIGH (ref 70–99)
Glucose-Capillary: 171 mg/dL — ABNORMAL HIGH (ref 70–99)

## 2022-10-17 LAB — MAGNESIUM: Magnesium: 1.8 mg/dL (ref 1.7–2.4)

## 2022-10-17 MED ORDER — ACETAMINOPHEN 500 MG PO TABS
1000.0000 mg | ORAL_TABLET | Freq: Four times a day (QID) | ORAL | Status: DC
  Administered 2022-10-17 – 2022-10-20 (×9): 1000 mg via ORAL
  Filled 2022-10-17 (×11): qty 2

## 2022-10-17 MED ORDER — TRAMADOL HCL 50 MG PO TABS: 50.0000 mg | ORAL_TABLET | Freq: Four times a day (QID) | ORAL | Status: DC | PRN

## 2022-10-17 MED ORDER — MOMETASONE FURO-FORMOTEROL FUM 200-5 MCG/ACT IN AERO
2.0000 | INHALATION_SPRAY | Freq: Two times a day (BID) | RESPIRATORY_TRACT | Status: DC
  Administered 2022-10-17 – 2022-10-20 (×4): 2 via RESPIRATORY_TRACT
  Filled 2022-10-17: qty 8.8

## 2022-10-17 MED ORDER — ALBUTEROL SULFATE (2.5 MG/3ML) 0.083% IN NEBU: 3.0000 mL | INHALATION_SOLUTION | RESPIRATORY_TRACT | Status: DC | PRN

## 2022-10-17 MED ORDER — POTASSIUM CHLORIDE 10 MEQ/100ML IV SOLN
10.0000 meq | INTRAVENOUS | Status: AC
  Administered 2022-10-17 (×4): 10 meq via INTRAVENOUS
  Filled 2022-10-17 (×4): qty 100

## 2022-10-17 MED ORDER — PANTOPRAZOLE SODIUM 40 MG IV SOLR
40.0000 mg | INTRAVENOUS | Status: DC
  Filled 2022-10-17: qty 10

## 2022-10-17 MED ORDER — CARVEDILOL 25 MG PO TABS
25.0000 mg | ORAL_TABLET | Freq: Two times a day (BID) | ORAL | Status: DC
  Administered 2022-10-17 – 2022-10-20 (×7): 25 mg via ORAL
  Filled 2022-10-17 (×7): qty 1

## 2022-10-17 MED ORDER — CALCIUM POLYCARBOPHIL 625 MG PO TABS
625.0000 mg | ORAL_TABLET | Freq: Two times a day (BID) | ORAL | Status: DC
  Administered 2022-10-17 – 2022-10-20 (×7): 625 mg via ORAL
  Filled 2022-10-17 (×6): qty 1

## 2022-10-17 MED ORDER — SODIUM CHLORIDE 0.9% FLUSH
3.0000 mL | Freq: Two times a day (BID) | INTRAVENOUS | Status: DC
  Administered 2022-10-18 – 2022-10-19 (×2): 3 mL via INTRAVENOUS

## 2022-10-17 MED ORDER — SODIUM CHLORIDE 0.9 % IV SOLN: 250.0000 mL | INTRAVENOUS | Status: DC | PRN

## 2022-10-17 MED ORDER — LACTATED RINGERS IV BOLUS: 1000.0000 mL | Freq: Three times a day (TID) | INTRAVENOUS | Status: AC | PRN

## 2022-10-17 MED ORDER — SODIUM CHLORIDE 0.9% FLUSH: 3.0000 mL | INTRAVENOUS | Status: DC | PRN

## 2022-10-17 MED ORDER — MAGIC MOUTHWASH: 15.0000 mL | Freq: Four times a day (QID) | ORAL | Status: DC | PRN

## 2022-10-17 MED ORDER — MENTHOL 3 MG MT LOZG: 1.0000 | LOZENGE | OROMUCOSAL | Status: DC | PRN

## 2022-10-17 MED ORDER — METHOCARBAMOL 500 MG PO TABS
1000.0000 mg | ORAL_TABLET | Freq: Four times a day (QID) | ORAL | Status: DC | PRN
  Administered 2022-10-18: 1000 mg via ORAL
  Filled 2022-10-17: qty 2

## 2022-10-17 MED ORDER — LIP MEDEX EX OINT
TOPICAL_OINTMENT | Freq: Two times a day (BID) | CUTANEOUS | Status: DC
  Administered 2022-10-17: 75 via TOPICAL
  Filled 2022-10-17: qty 7

## 2022-10-17 MED ORDER — ALUM & MAG HYDROXIDE-SIMETH 200-200-20 MG/5ML PO SUSP: 30.0000 mL | Freq: Four times a day (QID) | ORAL | Status: DC | PRN

## 2022-10-17 MED ORDER — SIMETHICONE 40 MG/0.6ML PO SUSP: 80.0000 mg | Freq: Four times a day (QID) | ORAL | Status: DC | PRN

## 2022-10-17 MED ORDER — MAGNESIUM SULFATE 2 GM/50ML IV SOLN
2.0000 g | Freq: Once | INTRAVENOUS | Status: AC
  Administered 2022-10-17: 2 g via INTRAVENOUS
  Filled 2022-10-17: qty 50

## 2022-10-17 NOTE — Progress Notes (Signed)
Jacob Guerrero [id number] 05-27-1946  CARE TEAM:  PCP: Pcp, No  Outpatient Care Team: Patient Care Team: Pcp, No as PCP - General  Inpatient Treatment Team: Treatment Team: Attending Provider: Eugenie Filler, MD; Rounding Team: Fatima Blank, MD; Mobility Specialist: Trevor Mace; Consulting Physician: Edison Pace, Md, MD; Registered Nurse: Dorinda Hill, RN; Utilization Review: Erlenmeyer, Areta Haber, RN; Licensed Practical Nurse: Loree Fee, LPN; Case Manager: Addison Naegeli, RN; Pharmacist: Luiz Ochoa, [facility]; Mobility Specialist: Shelly Coss   Problem List:   Principal Problem:   SBO (small bowel obstruction) [name]) Active Problems:   BPH (benign prostatic hyperplasia)   CAD (coronary artery disease)   CKD (chronic kidney disease) stage 3, GFR 30-59 ml/min (HCC)   Diabetes mellitus ([name])   Essential hypertension   GERD (gastroesophageal reflux disease)   Gout   HLD (hyperlipidemia)   3 Days Post-Op  10/14/2022  Preoperative Diagnosis: Small bowel obstruction  Postoperative Diagnosis: Small bowel mass   Procedure: Diagnostic laparoscopy, laparoscopic-assisted small bowel resection   Surgeon: Michaelle Birks, MD  Findings:  Diffuse small bowel dilation with distal decompression, without a distinct transition zone. A nodule was identified on the surface of the small bowel in the ileum, and on palpation this appeared to extend into an intraluminal mass. A small bowel resection was performed, a total length of about 12cm. No other small bowel abnormalities or peritoneal nodules were noted.    Assessment  Ileus resolving  [facility] Stay = 8 days)  SBO, small bowel mass -POD#2 s/p Diagnostic laparoscopy, laparoscopic-assisted small bowel resection 2/28 Dr. Zenia Guerrero - path pending -patient and just about it.  Tried to reassure him.  Hopefully get answers to see what this is. -With return of bowel function with loose bowel movements, start with  clear liquids.  Also advance to dysphagia 1.  Would not go too aggressive. - mobilize. -Likely go back to oral medicines.  Defer to medicine service.  Discussed with Dr. Grandville Silos who is primary today. Approved.     ID - ancef periop.  Still with some leukocytosis but seems to be coming down and does not look infected.  Hold off on more aggressive antibiotic regimen for now. FEN -stop IV fluids.  PRN bolus backup.  Elevated creatinine coming down and nonoliguric. VTE - SCDs, sq heparin Foley - none   AKI on CKD DM2 GERD BPH  Gout  HLD CAD Dysuria   Disposition: per primary service-Home with a few days when meets goals       I reviewed nursing notes, hospitalist notes, last 24 h vitals and pain scores, last 48 h intake and output, last 24 h labs and trends, and last 24 h imaging results. I have reviewed this patient's available data, including medical history, events of note, test results, etc as part of my evaluation.  A significant portion of that time was spent in counseling.  Care during the described time interval was provided by me.  This care required moderate level of medical decision making.  10/17/2022    Subjective: (Chief complaint)  Patient with loose bowel movements.  Thursday.  Wanting something to drink.  Denies nausea.  Feels a little bloated and "not normal" but not worse.  Sitting up in chair.  Objective:  Vital signs:  Vitals:   10/16/22 1128 10/16/22 2130 10/17/22 0542 10/17/22 0803  BP: 126/63 122/75 (!) 144/64   Pulse: 85 90 79   Resp: '14 16 16   '$ Temp: (!) 97.5 F (  36.4 C) 97.8 F (36.6 C) 98.5 F (36.9 C)   TempSrc: Oral Oral Oral   SpO2: 96% 95% 97% 93%  Weight:      Height:        Last BM Date : 10/16/22  Intake/Output   Yesterday:  03/01 0701 - 03/02 0700 In: 2393.9 [P.O.:60; I.V.:2033.9; IV Piggyback:300] Out: 400 [Urine:400] This shift:  No intake/output data recorded.  Bowel function:  Flatus: YES  BM:  YES  Drain:  (No drain)   Physical Exam:  General: Pt awake/alert in no acute distress Eyes: PERRL, normal EOM.  Sclera clear.  No icterus Neuro: CN II-XII intact w/o focal sensory/motor deficits. Lymph: No head/neck/groin lymphadenopathy Psych:  No delerium/psychosis/paranoia.  Oriented x 4 HENT: Normocephalic, Mucus membranes moist.  No thrush Neck: Supple, No tracheal deviation.  No obvious thyromegaly Chest: No pain to chest wall compression.  Good respiratory excursion.  No audible wheezing CV:  Pulses intact.  Regular rhythm.  No major extremity edema MS: Normal AROM mjr joints.  No obvious deformity  Abdomen: Soft.  Mildy distended.  Nontender.  No evidence of peritonitis.  No incarcerated hernias.  Ext:   No deformity.  No mjr edema.  No cyanosis Skin: No petechiae / purpurea.  No major sores.  Warm and dry    Results:   Cultures: Recent Results (from the past 720 hour(s))  Resp panel by RT-PCR (RSV, Flu A&B, Covid) Anterior Nasal Swab     Status: None   Collection Time: 10/09/22  1:14 PM   Specimen: Anterior Nasal Swab  Result Value Ref Range Status   SARS Coronavirus 2 by RT PCR NEGATIVE NEGATIVE Final    Comment: (NOTE) SARS-CoV-2 target nucleic acids are NOT DETECTED.  The SARS-CoV-2 RNA is generally detectable in upper respiratory specimens during the acute phase of infection. The lowest concentration of SARS-CoV-2 viral copies this assay can detect is 138 copies/mL. A negative result does not preclude SARS-Cov-2 infection and should not be used as the sole basis for treatment or other patient management decisions. A negative result may occur with  improper specimen collection/handling, submission of specimen other than nasopharyngeal swab, presence of viral mutation(s) within the areas targeted by this assay, and inadequate number of viral copies(<138 copies/mL). A negative result must be combined with clinical observations, patient history, and  epidemiological information. The expected result is Negative.  Fact Sheet for Patients:  EntrepreneurPulse.com.au  Fact Sheet for Healthcare Providers:  IncredibleEmployment.be  This test is no t yet approved or cleared by the [place] FDA and  has been authorized for detection and/or diagnosis of SARS-CoV-2 by FDA under an Emergency Use Authorization (EUA). This EUA will remain  in effect (meaning this test can be used) for the duration of the COVID-19 declaration under Section 564(b)(1) of the Act, 21 U.S.C.section 360bbb-3(b)(1), unless the authorization is terminated  or revoked sooner.       Influenza A by PCR NEGATIVE NEGATIVE Final   Influenza B by PCR NEGATIVE NEGATIVE Final    Comment: (NOTE) The Xpert Xpress SARS-CoV-2/FLU/RSV plus assay is intended as an aid in the diagnosis of influenza from Nasopharyngeal swab specimens and should not be used as a sole basis for treatment. Nasal washings and aspirates are unacceptable for Xpert Xpress SARS-CoV-2/FLU/RSV testing.  Fact Sheet for Patients: EntrepreneurPulse.com.au  Fact Sheet for Healthcare Providers: IncredibleEmployment.be  This test is not yet approved or cleared by the [place] FDA and has been authorized for detection and/or diagnosis of  SARS-CoV-2 by FDA under an Emergency Use Authorization (EUA). This EUA will remain in effect (meaning this test can be used) for the duration of the COVID-19 declaration under Section 564(b)(1) of the Act, 21 U.S.C. section 360bbb-3(b)(1), unless the authorization is terminated or revoked.     Resp Syncytial Virus by PCR NEGATIVE NEGATIVE Final    Comment: (NOTE) Fact Sheet for Patients: EntrepreneurPulse.com.au  Fact Sheet for Healthcare Providers: IncredibleEmployment.be  This test is not yet approved or cleared by the [place] FDA and has been  authorized for detection and/or diagnosis of SARS-CoV-2 by FDA under an Emergency Use Authorization (EUA). This EUA will remain in effect (meaning this test can be used) for the duration of the COVID-19 declaration under Section 564(b)(1) of the Act, 21 U.S.C. section 360bbb-3(b)(1), unless the authorization is terminated or revoked.  Performed at [facility], [place] [address] [place], [place] [address]   Urine Culture     Status: None   Collection Time: 10/15/22  3:34 PM   Specimen: Urine, Clean Catch  Result Value Ref Range Status   Specimen Description   Final    URINE, CLEAN CATCH Performed at [facility], [place] [address], [place], [place] [address]    Special Requests   Final    NONE Performed at [facility], [place] [address] [place], [place] [address]    Culture   Final    NO GROWTH Performed at [facility], [place] [address], [place], [place] [address]    Report Status 10/17/2022 FINAL  Final    Labs: Results for orders placed or performed during the hospital encounter of 10/09/22 (from the past 48 hour(s))  Glucose, capillary     Status: Abnormal   Collection Time: 10/15/22 11:34 AM  Result Value Ref Range   Glucose-Capillary 187 (H) 70 - 99 mg/dL    Comment: Glucose reference range applies only to samples taken after fasting for at least 8 hours.  Urinalysis, Routine w reflex microscopic -Urine, Clean Catch     Status: Abnormal   Collection Time: 10/15/22 12:35 PM  Result Value Ref Range   Color, Urine YELLOW YELLOW   APPearance HAZY (A) CLEAR   Specific Gravity, Urine 1.017 1.005 - 1.030   pH 5.0 5.0 - 8.0   Glucose, UA NEGATIVE NEGATIVE mg/dL   Hgb urine dipstick MODERATE (A) NEGATIVE   Bilirubin Urine NEGATIVE NEGATIVE   Ketones, ur NEGATIVE NEGATIVE mg/dL   Protein, ur 100 (A) NEGATIVE mg/dL   Nitrite NEGATIVE NEGATIVE   Leukocytes,Ua NEGATIVE NEGATIVE   RBC / HPF 6-10 0 - 5 RBC/hpf   WBC, UA  0-5 0 - 5 WBC/hpf   Bacteria, UA RARE (A) NONE SEEN   Squamous Epithelial / HPF 0-5 0 - 5 /HPF   Mucus PRESENT    Hyaline Casts, UA PRESENT     Comment: Performed at [facility], [place] [address], [place], [place] [address]  Urine Culture     Status: None   Collection Time: 10/15/22  3:34 PM   Specimen: Urine, Clean Catch  Result Value Ref Range   Specimen Description      URINE, CLEAN CATCH Performed at [facility], [place] [address], [place], [place] [address]    Special Requests      NONE Performed at [facility], [place] [address], [place], [place] [address]    Culture      NO GROWTH Performed  at [facility], [place] [address] [place], [place] [address]    Report Status 10/17/2022 FINAL   Glucose, capillary     Status: Abnormal   Collection Time: 10/15/22  3:48 PM  Result Value Ref Range   Glucose-Capillary 166 (H) 70 - 99 mg/dL    Comment: Glucose reference range applies only to samples taken after fasting for at least 8 hours.  Glucose, capillary     Status: Abnormal   Collection Time: 10/15/22  7:53 PM  Result Value Ref Range   Glucose-Capillary 150 (H) 70 - 99 mg/dL    Comment: Glucose reference range applies only to samples taken after fasting for at least 8 hours.  Glucose, capillary     Status: Abnormal   Collection Time: 10/16/22 12:27 AM  Result Value Ref Range   Glucose-Capillary 145 (H) 70 - 99 mg/dL    Comment: Glucose reference range applies only to samples taken after fasting for at least 8 hours.  Glucose, capillary     Status: Abnormal   Collection Time: 10/16/22  4:09 AM  Result Value Ref Range   Glucose-Capillary 180 (H) 70 - 99 mg/dL    Comment: Glucose reference range applies only to samples taken after fasting for at least 8 hours.  CBC     Status: Abnormal   Collection Time: 10/16/22  5:27 AM  Result Value Ref Range   WBC 22.4 (H) 4.0 - 10.5 K/uL   RBC 3.41 (L) 4.22 - 5.81 MIL/uL    Hemoglobin 10.3 (L) 13.0 - 17.0 g/dL   HCT 32.1 (L) 39.0 - 52.0 %   MCV 94.1 80.0 - 100.0 fL   MCH 30.2 26.0 - 34.0 pg   MCHC 32.1 30.0 - 36.0 g/dL   RDW 14.7 11.5 - 15.5 %   Platelets 158 150 - 400 K/uL   nRBC 0.0 0.0 - 0.2 %    Comment: Performed at [facility], [place] [address] [place], [place] [id number]  Basic metabolic panel     Status: Abnormal   Collection Time: 10/16/22  5:27 AM  Result Value Ref Range   Sodium 140 135 - 145 mmol/L   Potassium 3.4 (L) 3.5 - 5.1 mmol/L   Chloride 108 98 - 111 mmol/L   CO2 19 (L) 22 - 32 mmol/L   Glucose, Bld 199 (H) 70 - 99 mg/dL    Comment: Glucose reference range applies only to samples taken after fasting for at least 8 hours.   BUN 28 (H) 8 - 23 mg/dL   Creatinine, Ser 2.39 (H) 0.61 - 1.24 mg/dL   Calcium 8.3 (L) 8.9 - 10.3 mg/dL   GFR, Estimated 27 (L) >60 mL/min    Comment: (NOTE) Calculated using the CKD-EPI Creatinine Equation (2021)    Anion gap 13 5 - 15    Comment: Performed at [facility], [place] [address] [place], [place] [address]  Magnesium     Status: None   Collection Time: 10/16/22  5:27 AM  Result Value Ref Range   Magnesium 2.1 1.7 - 2.4 mg/dL    Comment: Performed at [facility], [place] [address], [place], [place] [address]  Glucose, capillary     Status: Abnormal   Collection Time: 10/16/22  7:36 AM  Result Value Ref Range   Glucose-Capillary 164 (H) 70 - 99 mg/dL    Comment: Glucose reference range applies only to samples taken after fasting for at least 8 hours.  Glucose,  capillary     Status: Abnormal   Collection Time: 10/16/22 11:27 AM  Result Value Ref Range   Glucose-Capillary 201 (H) 70 - 99 mg/dL    Comment: Glucose reference range applies only to samples taken after fasting for at least 8 hours.  Glucose, capillary     Status: Abnormal   Collection Time: 10/16/22  3:30 PM  Result Value Ref Range   Glucose-Capillary 196 (H) 70 - 99 mg/dL     Comment: Glucose reference range applies only to samples taken after fasting for at least 8 hours.  Glucose, capillary     Status: Abnormal   Collection Time: 10/16/22  8:00 PM  Result Value Ref Range   Glucose-Capillary 190 (H) 70 - 99 mg/dL    Comment: Glucose reference range applies only to samples taken after fasting for at least 8 hours.  Glucose, capillary     Status: Abnormal   Collection Time: 10/16/22 11:09 PM  Result Value Ref Range   Glucose-Capillary 204 (H) 70 - 99 mg/dL    Comment: Glucose reference range applies only to samples taken after fasting for at least 8 hours.  Glucose, capillary     Status: Abnormal   Collection Time: 10/17/22  3:45 AM  Result Value Ref Range   Glucose-Capillary 164 (H) 70 - 99 mg/dL    Comment: Glucose reference range applies only to samples taken after fasting for at least 8 hours.  CBC     Status: Abnormal   Collection Time: 10/17/22  5:28 AM  Result Value Ref Range   WBC 19.2 (H) 4.0 - 10.5 K/uL   RBC 3.11 (L) 4.22 - 5.81 MIL/uL   Hemoglobin 9.2 (L) 13.0 - 17.0 g/dL   HCT 28.8 (L) 39.0 - 52.0 %   MCV 92.6 80.0 - 100.0 fL   MCH 29.6 26.0 - 34.0 pg   MCHC 31.9 30.0 - 36.0 g/dL   RDW 14.7 11.5 - 15.5 %   Platelets 167 150 - 400 K/uL   nRBC 0.1 0.0 - 0.2 %    Comment: Performed at [facility], [place] [address], [place], [place] [id number]  Basic metabolic panel     Status: Abnormal   Collection Time: 10/17/22  5:28 AM  Result Value Ref Range   Sodium 144 135 - 145 mmol/L   Potassium 3.6 3.5 - 5.1 mmol/L   Chloride 110 98 - 111 mmol/L   CO2 22 22 - 32 mmol/L   Glucose, Bld 166 (H) 70 - 99 mg/dL    Comment: Glucose reference range applies only to samples taken after fasting for at least 8 hours.   BUN 29 (H) 8 - 23 mg/dL   Creatinine, Ser 1.79 (H) 0.61 - 1.24 mg/dL   Calcium 8.4 (L) 8.9 - 10.3 mg/dL   GFR, Estimated 39 (L) >60 mL/min    Comment: (NOTE) Calculated using the CKD-EPI Creatinine Equation (2021)     Anion gap 12 5 - 15    Comment: Performed at [facility], [place] [address], [place], [place] [address]  Magnesium     Status: None   Collection Time: 10/17/22  5:28 AM  Result Value Ref Range   Magnesium 1.8 1.7 - 2.4 mg/dL    Comment: Performed at [facility], [place] [address], [place], [place] [address]  Glucose, capillary     Status: Abnormal   Collection Time: 10/17/22  7:46 AM  Result Value Ref Range   Glucose-Capillary 155 (  H) 70 - 99 mg/dL    Comment: Glucose reference range applies only to samples taken after fasting for at least 8 hours.    Imaging / Studies: DG Abd Portable 1V  Result Date: 10/16/2022 CLINICAL DATA:  267752 Post-operative nausea and vomiting 267752 EXAM: PORTABLE ABDOMEN - 1 VIEW COMPARISON:  Radiograph 10/14/2022 FINDINGS: There are multiple dilated loops of small bowel, similar to prior exam. Patient underwent recent diagnostic laparoscopy with small-bowel resection on 10/14/2022. IMPRESSION: Persistent multiple dilated loops of small bowel, most likely postoperative ileus, but possibly ongoing obstruction. Recommend radiographic follow-up. Electronically Signed   By: Maurine Simmering M.D.   On: 10/16/2022 11:39    Medications / Allergies: per chart  Antibiotics: Anti-infectives (From admission, onward)    Start     Dose/Rate Route Frequency Ordered Stop   10/14/22 1153  ceFAZolin (ANCEF) IVPB 2g/100 mL premix        2 g 200 mL/hr over 30 Minutes Intravenous On call to O.R. 10/14/22 1148 10/14/22 1326         Note: Portions of this report may have been transcribed using voice recognition software. Every effort was made to ensure accuracy; however, inadvertent computerized transcription errors may be present.   Any transcriptional errors that result from this process are unintentional.    Adin Hector, MD, FACS, MASCRS Esophageal, Gastrointestinal & Colorectal Surgery Robotic and Minimally Invasive  Surgery  [facility] [place]. [address], [place], [place] [address] [phone] Fax [phone] Main  CONTACT INFORMATION:  Weekday (9AM-5PM): Call [facility] main office at [phone]  Weeknight (5PM-9AM) or Weekend/Holiday: Check [email or web address] (password " [email or web address]") for General Surgery [facility] coverage  (Please, do not use SecureChat as it is not reliable communication to reach operating surgeons for immediate patient care given surgeries/outpatient duties/clinic/cross-coverage/off post-call which would lead to a delay in care.  Epic staff messaging available for outptient concerns, but may not be answered for 48 hours or more).     10/17/2022  10:39 AM

## 2022-10-17 NOTE — Progress Notes (Signed)
Mobility Specialist - Progress Note   10/17/22 1549  Mobility  Activity Ambulated with assistance in hallway  Level of Assistance Modified independent, requires aide device or extra time  Assistive Device Front wheel walker  Distance Ambulated (ft) 500 ft  Range of Motion/Exercises Active  Activity Response Tolerated well  $Mobility charge 1 Mobility   Pt was found in bed and agreeable to ambulate. Had no complaints during session and at EOS returned to [facility]. RN notified.  Ferd Hibbs Mobility Specialist

## 2022-10-17 NOTE — Progress Notes (Signed)
PROGRESS NOTE    Jacob Guerrero  [id number] DOB: Jul 25, 1946 DOA: 10/09/2022 PCP: Pcp, No    Chief Complaint  Patient presents with   Abdominal Pain    Brief Narrative:  Jacob Guerrero is a 77 y.o. male with medical history significant of CKD, DM2, HTN, Gout, Chronic bronchitis, GERD, glaucoma, RBBB, CAD with CABG and pacemaker, BPH, anemia, OA, HLD who presents for abdominal pain and found to have an SBO.  Patient was admitted to the floor started NG tube IV fluids and n.p.o. status.  NG tube came out 2 days ago overnight.  He is now not able to tolerate a diet due to abdominal pain.  He is nauseous surgery has been reconsulted.  Patient seen again by general surgery on 10/14/2022 who are recommending diagnostic laparoscopy and possible laparotomy as KUB done 10/14/2022 with increased small bowel dilatation consistent with obstruction.   Assessment & Plan:   Principal Problem:   SBO (small bowel obstruction) (HCC) Active Problems:   BPH (benign prostatic hyperplasia)   CAD (coronary artery disease)   CKD (chronic kidney disease) stage 3, GFR 30-59 ml/min (HCC)   Diabetes mellitus (HCC)   Essential hypertension   GERD (gastroesophageal reflux disease)   Gout   HLD (hyperlipidemia)  #1 partial small bowel obstruction secondary to small bowel mass per Diagnostic laparoscopy, laparoscopic assisted small bowel resection 10/14/2022/clinical postop ileus -Patient noted to have presented with abdominal pain, some nausea and vomiting CT abdomen and pelvis done concerning for small bowel obstruction with transition point in the right abdomen, short segment of wall thickening of the small bowel at the transition point. -KUB 10/13/2022 with increased number of moderately dilated small bowel loops favor progressive small bowel obstruction. -Patient was downgraded to clear liquids on 10/13/2022. -Patient initially seen by general surgery, patient improved clinically and then noted to  have passed the small bowel obstruction protocol with contrast noted in the colon. -Patient however noted to have minimal flatus, some loose stool minimal, ongoing pain, some slight increase in abdominal distention. -KUB done on the 04/06/2023 with increased small bowel dilatation concerning for distal small bowel obstruction. -Patient reassessed by general surgery who reviewed KUB films and recommended diagnostic laparoscopy and possible laparotomy which patient underwent on 10/14/2022 which showed a small bowel mass and patient underwent laparoscopic assisted small bowel resection.  Biopsies pending. -Patient with emesis this morning, abdominal distention, diffuse abdominal pain, concern for postop ileus. -Patient noted to have bowel movement yesterday morning, having bowel movement during my examination.   -Patient with concern for clinical postop ileus which seems to be improving.   -Patient started on clears per general surgery today.   -Mobilize.   -Supportive care.  -Per general surgery.  2.  AKI on CKD stage IIIb with metabolic acidosis -Likely secondary to prerenal azotemia in the setting of diuretics, secondary to SBO. -Patient hydrated with IV fluids with improving renal function, creatinine currently at 2.39. -Patient noted to have some episodes of emesis yesterday morning.   -Bowel starting to move.   -Renal function improved with hydration.   3.  Hypokalemia/hypomagnesemia -Potassium at 3.6 this morning, magnesium at 1.8.   -Magnesium sulfate 2 g IV x 1.   -KCl 10 mEq IV every hour x 4 rounds.  -Repeat labs in the AM.    4.  Type 2 diabetes mellitus -Hemoglobin A1c 7.1 (2/23/ 2024). -CBG 155 this morning.   -SSI.    5.  GERD -Place on IV PPI.  6.  BPH -Resume home regimen Proscar.    7.  History of gout -Allopurinol on hold.   -Likely resume once tolerating solid diet.   8.  Hyperlipidemia -Continue to hold statin and resume on discharge.  9.  CAD -Patient on  IV beta-blocker which we will discontinue her resume home regimen Coreg.   -Continue to hold Entresto and Lasix.  10.  Dysuria -Patient with complaints of dysuria postoperatively. -Urinalysis unremarkable.   -Supportive care.     DVT prophylaxis: Heparin Code Status: Full Family Communication: Updated patient.  No family at bedside. Disposition: Home when clinically improved and cleared by general surgery.  Status is: Inpatient Remains inpatient appropriate because: Severity of illness   Consultants:  General surgery: Dr. Barry Dienes 10/09/2022  Procedures:  CT abdomen and pelvis 10/09/2022 Abdominal films 10/11/2022, 10/12/2022, 10/13/2022, 10/14/2022, 10/16/2022 Small bowel obstruction protocol Diagnostic laparoscopy, laparoscopic assisted small bowel resection per general surgery: Dr. [name] 10/14/2022  Antimicrobials:  Anti-infectives (From admission, onward)    Start     Dose/Rate Route Frequency Ordered Stop   10/14/22 1153  ceFAZolin (ANCEF) IVPB 2g/100 mL premix        2 g 200 mL/hr over 30 Minutes Intravenous On call to O.R. 10/14/22 1148 10/14/22 1326         Subjective: Sitting on bedside commode having a bowel movement.  Still with complaints of diffuse abdominal pain and abdominal distention.  Denies any further episodes of emesis.  Having bowel movements.  No chest pain.  No shortness of breath.  Diet noted to have been advanced to a clear liquids this morning per general surgery.  Objective: Vitals:   10/16/22 2130 10/17/22 0542 10/17/22 0803 10/17/22 1328  BP: 122/75 (!) 144/64  123/66  Pulse: 90 79  84  Resp: '16 16  16  '$ Temp: 97.8 F (36.6 C) 98.5 F (36.9 C)  98.2 F (36.8 C)  TempSrc: Oral Oral  Oral  SpO2: 95% 97% 93% 99%  Weight:      Height:        Intake/Output Summary (Last 24 hours) at 10/17/2022 1425 Last data filed at 10/17/2022 1328 Gross per 24 hour  Intake 1556.06 ml  Output 400 ml  Net 1156.06 ml    Filed Weights   10/09/22 1224 10/14/22  1211  Weight: 92.5 kg 92.5 kg    Examination:  General exam: NAD Respiratory system: Lungs clear to auscultation bilaterally.  No wheezes, no crackles, no rhonchi.  Fair air movement.  Speaking in full sentences.  No use of accessory muscles of respiration.   Cardiovascular system: RRR no murmurs rubs or gallops.  No JVD.  No lower extremity edema.  Gastrointestinal system: Abdomen is soft, distended, hypoactive bowel sounds, some diffuse tenderness to palpation.  Incision sites c/d/I. Central nervous system: Alert and oriented.  Moving extremities spontaneously.  No focal neurological deficits.  Extremities: Symmetric 5 x 5 power. Skin: No rashes, lesions or ulcers Psychiatry: Judgement and insight appear normal. Mood & affect appropriate.     Data Reviewed: I have personally reviewed following labs and imaging studies  CBC: Recent Labs  Lab 10/13/22 0502 10/14/22 0438 10/15/22 0437 10/16/22 0527 10/17/22 0528  WBC 9.7 11.6* 16.2* 22.4* 19.2*  NEUTROABS  --   --  12.7*  --   --   HGB 10.5* 10.7* 10.8* 10.3* 9.2*  HCT 33.0* 33.2* 33.3* 32.1* 28.8*  MCV 93.5 93.8 93.0 94.1 92.6  PLT 169 176 160 158 167  Basic Metabolic Panel: Recent Labs  Lab 10/13/22 1414 10/14/22 0904 10/15/22 0437 10/16/22 0527 10/17/22 0528  NA 141 144 141 140 144  K 3.5 3.4* 3.9 3.4* 3.6  CL 110 110 109 108 110  CO2 '23 24 22 '$ 19* 22  GLUCOSE 204* 178* 185* 199* 166*  BUN 27* 24* 24* 28* 29*  CREATININE 2.09* 2.05* 2.10* 2.39* 1.79*  CALCIUM 8.9 8.8* 8.2* 8.3* 8.4*  MG  --  1.6* 2.1 2.1 1.8     GFR: Estimated Creatinine Clearance: 42.4 mL/min (A) (by C-G formula based on SCr of 1.79 mg/dL (H)).  Liver Function Tests: Recent Labs  Lab 10/11/22 0817 10/13/22 1414 10/14/22 0904  AST 15 11* 11*  ALT '11 8 8  '$ ALKPHOS 43 44 45  BILITOT 0.6 0.6 0.6  PROT 6.6 6.5 6.5  ALBUMIN 3.5 3.2* 3.2*     CBG: Recent Labs  Lab 10/16/22 2000 10/16/22 2309 10/17/22 0345 10/17/22 0746  10/17/22 1136  GLUCAP 190* 204* 164* 155* 164*      Recent Results (from the past 240 hour(s))  Resp panel by RT-PCR (RSV, Flu A&B, Covid) Anterior Nasal Swab     Status: None   Collection Time: 10/09/22  1:14 PM   Specimen: Anterior Nasal Swab  Result Value Ref Range Status   SARS Coronavirus 2 by RT PCR NEGATIVE NEGATIVE Final    Comment: (NOTE) SARS-CoV-2 target nucleic acids are NOT DETECTED.  The SARS-CoV-2 RNA is generally detectable in upper respiratory specimens during the acute phase of infection. The lowest concentration of SARS-CoV-2 viral copies this assay can detect is 138 copies/mL. A negative result does not preclude SARS-Cov-2 infection and should not be used as the sole basis for treatment or other patient management decisions. A negative result may occur with  improper specimen collection/handling, submission of specimen other than nasopharyngeal swab, presence of viral mutation(s) within the areas targeted by this assay, and inadequate number of viral copies(<138 copies/mL). A negative result must be combined with clinical observations, patient history, and epidemiological information. The expected result is Negative.  Fact Sheet for Patients:  EntrepreneurPulse.com.au  Fact Sheet for Healthcare Providers:  IncredibleEmployment.be  This test is no t yet approved or cleared by the [place] FDA and  has been authorized for detection and/or diagnosis of SARS-CoV-2 by FDA under an Emergency Use Authorization (EUA). This EUA will remain  in effect (meaning this test can be used) for the duration of the COVID-19 declaration under Section 564(b)(1) of the Act, 21 U.S.C.section 360bbb-3(b)(1), unless the authorization is terminated  or revoked sooner.       Influenza A by PCR NEGATIVE NEGATIVE Final   Influenza B by PCR NEGATIVE NEGATIVE Final    Comment: (NOTE) The Xpert Xpress SARS-CoV-2/FLU/RSV plus assay is  intended as an aid in the diagnosis of influenza from Nasopharyngeal swab specimens and should not be used as a sole basis for treatment. Nasal washings and aspirates are unacceptable for Xpert Xpress SARS-CoV-2/FLU/RSV testing.  Fact Sheet for Patients: EntrepreneurPulse.com.au  Fact Sheet for Healthcare Providers: IncredibleEmployment.be  This test is not yet approved or cleared by the [place] FDA and has been authorized for detection and/or diagnosis of SARS-CoV-2 by FDA under an Emergency Use Authorization (EUA). This EUA will remain in effect (meaning this test can be used) for the duration of the COVID-19 declaration under Section 564(b)(1) of the Act, 21 U.S.C. section 360bbb-3(b)(1), unless the authorization is terminated or revoked.     Resp Syncytial  Virus by PCR NEGATIVE NEGATIVE Final    Comment: (NOTE) Fact Sheet for Patients: EntrepreneurPulse.com.au  Fact Sheet for Healthcare Providers: IncredibleEmployment.be  This test is not yet approved or cleared by the [place] FDA and has been authorized for detection and/or diagnosis of SARS-CoV-2 by FDA under an Emergency Use Authorization (EUA). This EUA will remain in effect (meaning this test can be used) for the duration of the COVID-19 declaration under Section 564(b)(1) of the Act, 21 U.S.C. section 360bbb-3(b)(1), unless the authorization is terminated or revoked.  Performed at [facility], [place] [address], [place], [place] [address]   Urine Culture     Status: None   Collection Time: 10/15/22  3:34 PM   Specimen: Urine, Clean Catch  Result Value Ref Range Status   Specimen Description   Final    URINE, CLEAN CATCH Performed at [facility], [place] [address], [place], [place] [address]    Special Requests   Final    NONE Performed at [facility], [place] [address],  [place], [place] [address]    Culture   Final    NO GROWTH Performed at [facility], [place] [address] [place], [place] [address]    Report Status 10/17/2022 FINAL  Final         Radiology Studies: DG Abd Portable 1V  Result Date: 10/16/2022 CLINICAL DATA:  [id number] Post-operative nausea and vomiting [id number] EXAM: PORTABLE ABDOMEN - 1 VIEW COMPARISON:  Radiograph 10/14/2022 FINDINGS: There are multiple dilated loops of small bowel, similar to prior exam. Patient underwent recent diagnostic laparoscopy with small-bowel resection on 10/14/2022. IMPRESSION: Persistent multiple dilated loops of small bowel, most likely postoperative ileus, but possibly ongoing obstruction. Recommend radiographic follow-up. Electronically Signed   By: Maurine Simmering M.D.   On: 10/16/2022 11:39        Scheduled Meds:  acetaminophen  1,000 mg Oral Q6H   bisacodyl  10 mg Rectal Once   budesonide  0.25 mg Nebulization BID   dorzolamide  1 drop Both Eyes TID   feeding supplement  1 Container Oral BID BM   heparin injection (subcutaneous)  5,000 Units Subcutaneous Q8H   insulin aspart  0-15 Units Subcutaneous Q4H   lidocaine  1 patch Transdermal Q24H   lip balm   Topical BID   metoprolol tartrate  2.5 mg Intravenous Q8H   Netarsudil-Latanoprost  1 drop Both Eyes QHS   polycarbophil  625 mg Oral BID   sodium chloride flush  3 mL Intravenous Q12H   sodium chloride flush  3 mL Intravenous Q12H   Continuous Infusions:  sodium chloride     sodium chloride     lactated ringers       LOS: 8 days    Time spent: 35 minutes    Irine Seal, MD [facility]   To contact the attending provider between 7A-7P or the covering provider during after hours 7P-7A, please log into the web site [email or web address] and access using universal [place] password for that web site. If you do not have the password, please call the hospital operator.  10/17/2022, 2:25 PM

## 2022-10-18 DIAGNOSIS — I10 Essential (primary) hypertension: Secondary | ICD-10-CM | POA: Diagnosis not present

## 2022-10-18 DIAGNOSIS — K56609 Unspecified intestinal obstruction, unspecified as to partial versus complete obstruction: Secondary | ICD-10-CM | POA: Diagnosis not present

## 2022-10-18 DIAGNOSIS — I2585 Chronic coronary microvascular dysfunction: Secondary | ICD-10-CM | POA: Diagnosis not present

## 2022-10-18 DIAGNOSIS — N400 Enlarged prostate without lower urinary tract symptoms: Secondary | ICD-10-CM | POA: Diagnosis not present

## 2022-10-18 LAB — GLUCOSE, CAPILLARY
Glucose-Capillary: 136 mg/dL — ABNORMAL HIGH (ref 70–99)
Glucose-Capillary: 143 mg/dL — ABNORMAL HIGH (ref 70–99)
Glucose-Capillary: 155 mg/dL — ABNORMAL HIGH (ref 70–99)
Glucose-Capillary: 182 mg/dL — ABNORMAL HIGH (ref 70–99)
Glucose-Capillary: 193 mg/dL — ABNORMAL HIGH (ref 70–99)
Glucose-Capillary: 196 mg/dL — ABNORMAL HIGH (ref 70–99)
Glucose-Capillary: 198 mg/dL — ABNORMAL HIGH (ref 70–99)

## 2022-10-18 LAB — BASIC METABOLIC PANEL
Anion gap: 9 (ref 5–15)
BUN: 29 mg/dL — ABNORMAL HIGH (ref 8–23)
CO2: 23 mmol/L (ref 22–32)
Calcium: 8.4 mg/dL — ABNORMAL LOW (ref 8.9–10.3)
Chloride: 111 mmol/L (ref 98–111)
Creatinine, Ser: 1.51 mg/dL — ABNORMAL HIGH (ref 0.61–1.24)
GFR, Estimated: 47 mL/min — ABNORMAL LOW (ref >60)
Glucose, Bld: 143 mg/dL — ABNORMAL HIGH (ref 70–99)
Potassium: 3.5 mmol/L (ref 3.5–5.1)
Sodium: 143 mmol/L (ref 135–145)

## 2022-10-18 LAB — CBC
HCT: 27.6 % — ABNORMAL LOW (ref 39.0–52.0)
Hemoglobin: 9.1 g/dL — ABNORMAL LOW (ref 13.0–17.0)
MCH: 30.2 pg (ref 26.0–34.0)
MCHC: 33 g/dL (ref 30.0–36.0)
MCV: 91.7 fL (ref 80.0–100.0)
Platelets: 179 10*3/uL (ref 150–400)
RBC: 3.01 MIL/uL — ABNORMAL LOW (ref 4.22–5.81)
RDW: 14.6 % (ref 11.5–15.5)
WBC: 13.8 10*3/uL — ABNORMAL HIGH (ref 4.0–10.5)
nRBC: 0 % (ref 0.0–0.2)

## 2022-10-18 LAB — MAGNESIUM: Magnesium: 2.3 mg/dL (ref 1.7–2.4)

## 2022-10-18 MED ORDER — POTASSIUM CHLORIDE CRYS ER 10 MEQ PO TBCR
40.0000 meq | EXTENDED_RELEASE_TABLET | Freq: Once | ORAL | Status: AC
  Administered 2022-10-18: 40 meq via ORAL
  Filled 2022-10-18: qty 4

## 2022-10-18 MED ORDER — ALLOPURINOL 300 MG PO TABS: 300.0000 mg | ORAL_TABLET | Freq: Every day | ORAL | Status: DC

## 2022-10-18 MED ORDER — ALLOPURINOL 300 MG PO TABS
300.0000 mg | ORAL_TABLET | Freq: Every day | ORAL | Status: DC
  Administered 2022-10-18 – 2022-10-20 (×3): 300 mg via ORAL
  Filled 2022-10-18 (×3): qty 1

## 2022-10-18 MED ORDER — POTASSIUM CHLORIDE 10 MEQ/100ML IV SOLN: 10.0000 meq | INTRAVENOUS | Status: DC

## 2022-10-18 MED ORDER — ASPIRIN 81 MG PO TBEC
81.0000 mg | DELAYED_RELEASE_TABLET | Freq: Every day | ORAL | Status: DC
  Administered 2022-10-18 – 2022-10-20 (×3): 81 mg via ORAL
  Filled 2022-10-18 (×3): qty 1

## 2022-10-18 MED ORDER — NITROGLYCERIN 0.4 MG SL SUBL: 0.4000 mg | SUBLINGUAL_TABLET | SUBLINGUAL | Status: DC | PRN

## 2022-10-18 MED ORDER — ROSUVASTATIN CALCIUM 20 MG PO TABS
40.0000 mg | ORAL_TABLET | Freq: Every day | ORAL | Status: DC
  Administered 2022-10-18 – 2022-10-20 (×3): 40 mg via ORAL
  Filled 2022-10-18 (×3): qty 2

## 2022-10-18 MED ORDER — FINASTERIDE 5 MG PO TABS
5.0000 mg | ORAL_TABLET | Freq: Every day | ORAL | Status: DC
  Administered 2022-10-18 – 2022-10-20 (×3): 5 mg via ORAL
  Filled 2022-10-18 (×3): qty 1

## 2022-10-18 MED ORDER — PANTOPRAZOLE SODIUM 40 MG PO TBEC
40.0000 mg | DELAYED_RELEASE_TABLET | Freq: Every day | ORAL | Status: DC
  Administered 2022-10-18 – 2022-10-20 (×3): 40 mg via ORAL
  Filled 2022-10-18 (×3): qty 1

## 2022-10-18 MED ORDER — BISACODYL 10 MG RE SUPP: 10.0000 mg | Freq: Two times a day (BID) | RECTAL | Status: DC | PRN

## 2022-10-18 MED ORDER — VITAMIN D 25 MCG (1000 UNIT) PO TABS
2000.0000 [IU] | ORAL_TABLET | Freq: Every day | ORAL | Status: DC
  Administered 2022-10-18 – 2022-10-20 (×3): 2000 [IU] via ORAL
  Filled 2022-10-18 (×3): qty 2

## 2022-10-18 MED ORDER — INSULIN ASPART 100 UNIT/ML IJ SOLN
0.0000 [IU] | Freq: Three times a day (TID) | INTRAMUSCULAR | Status: DC
  Administered 2022-10-18 – 2022-10-20 (×6): 3 [IU] via SUBCUTANEOUS
  Administered 2022-10-20: 5 [IU] via SUBCUTANEOUS

## 2022-10-18 NOTE — Progress Notes (Addendum)
Jacob Guerrero [id number] 23-Jul-1946  CARE TEAM:  PCP: Pcp, No  Outpatient Care Team: Patient Care Team: Pcp, No as PCP - General  Inpatient Treatment Team: Treatment Team: Attending Provider: Eugenie Filler, MD; Rounding Team: Fatima Blank, MD; Mobility Specialist: Trevor Mace; Consulting Physician: Edison Pace, Md, MD; Registered Nurse: Dorinda Hill, RN; Technician: Tollie Eth, NT; Utilization Review: Erlenmeyer, Areta Haber, RN; Case Manager: Addison Naegeli, RN; Mobility Specialist: Ramirez-Flores, Hassan Rowan; Pharmacist: Luiz Ochoa, [facility]   Problem List:   Principal Problem:   SBO (small bowel obstruction) (HCC) Active Problems:   BPH (benign prostatic hyperplasia)   CAD (coronary artery disease)   CKD (chronic kidney disease) stage 3, GFR 30-59 ml/min (HCC)   Diabetes mellitus ([place])   Essential hypertension   GERD (gastroesophageal reflux disease)   Gout   HLD (hyperlipidemia)   4 Days Post-Op  10/14/2022  Preoperative Diagnosis: Small bowel obstruction  Postoperative Diagnosis: Small bowel mass   Procedure: Diagnostic laparoscopy, laparoscopic-assisted small bowel resection   Surgeon: Michaelle Birks, MD  Findings:  Diffuse small bowel dilation with distal decompression, without a distinct transition zone. A nodule was identified on the surface of the small bowel in the ileum, and on palpation this appeared to extend into an intraluminal mass. A small bowel resection was performed, a total length of about 12cm. No other small bowel abnormalities or peritoneal nodules were noted.    Assessment  Ileus resolving  [facility] Stay = 9 days)  SBO, small bowel mass -POD#4 s/p Diagnostic laparoscopy, laparoscopic-assisted small bowel resection 2/28 Dr. [name] -Path pending -hopefully will get answers this coming week.  Suspicion for carcinoid or perhaps small bowel cancer (though lack of lymphadenopathy grossly is a guardedly hopeful sign) -With return of  bowel function with even more loose bowel movements tolerating clears, will advance to dysphagia 1 diet.  Perhaps to soft diet later today if feeling better.   -Soluble fiber bowel regimen to help.  Suppositories as needed. -Likely go back to oral medicines.  Most started back up.  Defer to medicine service.  Discussed with Dr. Grandville Silos who is primary today. Approved.     ID - ancef periop.  Still with some leukocytosis but going down and no evidence of infection.  Hold off on any additional post adjuvant antibiotics at this time from a surgical standpoint.   FEN -stop IV fluids.  PRN bolus backup.  Elevated creatinine coming down and nonoliguric. VTE - SCDs, sq heparin Foley - none   AKI on CKD DM2 GERD BPH  Gout  HLD CAD Dysuria   Disposition: per primary service- hopefully Home with a few days when meets goals       I reviewed nursing notes, hospitalist notes, last 24 h vitals and pain scores, last 48 h intake and output, last 24 h labs and trends, and last 24 h imaging results. I have reviewed this patient's available data, including medical history, events of note, test results, etc as part of my evaluation.  A significant portion of that time was spent in counseling.  Care during the described time interval was provided by me.  This care required moderate level of medical decision making.  10/18/2022    Subjective: (Chief complaint)  Patient was a lot more loose bowel movements.  1 time to the point of incontinence.  Walking better.  Used walker for balance.  Less nauseated.  Tired of clears and wants to try something more.  Still somewhat bloated.  Seems  somewhat distant hearted/impatient to get better.  Objective:  Vital signs:  Vitals:   10/17/22 2000 10/17/22 2201 10/18/22 0412 10/18/22 0743  BP:  132/61 125/69   Pulse:  60 67   Resp:  16 15   Temp:  97.9 F (36.6 C) 97.7 F (36.5 C)   TempSrc:  Oral Oral   SpO2: 94% (!) 87% 97% 96%  Weight:       Height:        Last BM Date : 10/16/22  Intake/Output   Yesterday:  03/02 0701 - 03/03 0700 In: 2245 [P.O.:580; I.V.:815; IV Piggyback:450] Out: 120 [Urine:120] This shift:  No intake/output data recorded.  Bowel function:  Flatus: YES  BM:  YES  Drain: (No drain)   Physical Exam:  General: Pt awake/alert in no acute distress.  Sitting by window with some ennui - not toxic. Eyes: PERRL, normal EOM.  Sclera clear.  No icterus Neuro: CN II-XII intact w/o focal sensory/motor deficits. Lymph: No head/neck/groin lymphadenopathy Psych:  No delerium/psychosis/paranoia.  Oriented x 4 HENT: Normocephalic, Mucus membranes moist.  No thrush Neck: Supple, No tracheal deviation.  No obvious thyromegaly Chest: No pain to chest wall compression.  Good respiratory excursion.  No audible wheezing CV:  Pulses intact.  Regular rhythm.  No major extremity edema MS: Normal AROM mjr joints.  No obvious deformity  Abdomen: Obese but Soft.  Mildy distended.  Nontender.  Incision with Dermabond clean dry and intact.  No cellulitis.  No evidence of peritonitis.  No incarcerated hernias.  Ext:   No deformity.  No mjr edema.  No cyanosis Skin: No petechiae / purpurea.  No major sores.  Warm and dry    Results:   Cultures: Recent Results (from the past 720 hour(s))  Resp panel by RT-PCR (RSV, Flu A&B, Covid) Anterior Nasal Swab     Status: None   Collection Time: 10/09/22  1:14 PM   Specimen: Anterior Nasal Swab  Result Value Ref Range Status   SARS Coronavirus 2 by RT PCR NEGATIVE NEGATIVE Final    Comment: (NOTE) SARS-CoV-2 target nucleic acids are NOT DETECTED.  The SARS-CoV-2 RNA is generally detectable in upper respiratory specimens during the acute phase of infection. The lowest concentration of SARS-CoV-2 viral copies this assay can detect is 138 copies/mL. A negative result does not preclude SARS-Cov-2 infection and should not be used as the sole basis for treatment or other  patient management decisions. A negative result may occur with  improper specimen collection/handling, submission of specimen other than nasopharyngeal swab, presence of viral mutation(s) within the areas targeted by this assay, and inadequate number of viral copies(<138 copies/mL). A negative result must be combined with clinical observations, patient history, and epidemiological information. The expected result is Negative.  Fact Sheet for Patients:  EntrepreneurPulse.com.au  Fact Sheet for Healthcare Providers:  IncredibleEmployment.be  This test is no t yet approved or cleared by the [place] FDA and  has been authorized for detection and/or diagnosis of SARS-CoV-2 by FDA under an Emergency Use Authorization (EUA). This EUA will remain  in effect (meaning this test can be used) for the duration of the COVID-19 declaration under Section 564(b)(1) of the Act, 21 U.S.C.section 360bbb-3(b)(1), unless the authorization is terminated  or revoked sooner.       Influenza A by PCR NEGATIVE NEGATIVE Final   Influenza B by PCR NEGATIVE NEGATIVE Final    Comment: (NOTE) The Xpert Xpress SARS-CoV-2/FLU/RSV plus assay is intended as an aid in  the diagnosis of influenza from Nasopharyngeal swab specimens and should not be used as a sole basis for treatment. Nasal washings and aspirates are unacceptable for Xpert Xpress SARS-CoV-2/FLU/RSV testing.  Fact Sheet for Patients: EntrepreneurPulse.com.au  Fact Sheet for Healthcare Providers: IncredibleEmployment.be  This test is not yet approved or cleared by the [place] FDA and has been authorized for detection and/or diagnosis of SARS-CoV-2 by FDA under an Emergency Use Authorization (EUA). This EUA will remain in effect (meaning this test can be used) for the duration of the COVID-19 declaration under Section 564(b)(1) of the Act, 21 U.S.C. section  360bbb-3(b)(1), unless the authorization is terminated or revoked.     Resp Syncytial Virus by PCR NEGATIVE NEGATIVE Final    Comment: (NOTE) Fact Sheet for Patients: EntrepreneurPulse.com.au  Fact Sheet for Healthcare Providers: IncredibleEmployment.be  This test is not yet approved or cleared by the [place] FDA and has been authorized for detection and/or diagnosis of SARS-CoV-2 by FDA under an Emergency Use Authorization (EUA). This EUA will remain in effect (meaning this test can be used) for the duration of the COVID-19 declaration under Section 564(b)(1) of the Act, 21 U.S.C. section 360bbb-3(b)(1), unless the authorization is terminated or revoked.  Performed at [facility], [place] [address], [place], [place] [address]   Urine Culture     Status: None   Collection Time: 10/15/22  3:34 PM   Specimen: Urine, Clean Catch  Result Value Ref Range Status   Specimen Description   Final    URINE, CLEAN CATCH Performed at [facility], [place] [address] [place], [place] [address]    Special Requests   Final    NONE Performed at [facility], [place] [address] [place], [place] [address]    Culture   Final    NO GROWTH Performed at [facility], [place] [address], [place], [place] [address]    Report Status 10/17/2022 FINAL  Final    Labs: Results for orders placed or performed during the hospital encounter of 10/09/22 (from the past 48 hour(s))  Glucose, capillary     Status: Abnormal   Collection Time: 10/16/22 11:27 AM  Result Value Ref Range   Glucose-Capillary 201 (H) 70 - 99 mg/dL    Comment: Glucose reference range applies only to samples taken after fasting for at least 8 hours.  Glucose, capillary     Status: Abnormal   Collection Time: 10/16/22  3:30 PM  Result Value Ref Range   Glucose-Capillary 196 (H) 70 - 99 mg/dL    Comment: Glucose reference range applies  only to samples taken after fasting for at least 8 hours.  Glucose, capillary     Status: Abnormal   Collection Time: 10/16/22  8:00 PM  Result Value Ref Range   Glucose-Capillary 190 (H) 70 - 99 mg/dL    Comment: Glucose reference range applies only to samples taken after fasting for at least 8 hours.  Glucose, capillary     Status: Abnormal   Collection Time: 10/16/22 11:09 PM  Result Value Ref Range   Glucose-Capillary 204 (H) 70 - 99 mg/dL    Comment: Glucose reference range applies only to samples taken after fasting for at least 8 hours.  Glucose, capillary     Status: Abnormal   Collection Time: 10/17/22  3:45 AM  Result Value Ref Range   Glucose-Capillary 164 (H) 70 - 99 mg/dL    Comment: Glucose reference range applies only to samples  taken after fasting for at least 8 hours.  CBC     Status: Abnormal   Collection Time: 10/17/22  5:28 AM  Result Value Ref Range   WBC 19.2 (H) 4.0 - 10.5 K/uL   RBC 3.11 (L) 4.22 - 5.81 MIL/uL   Hemoglobin 9.2 (L) 13.0 - 17.0 g/dL   HCT 28.8 (L) 39.0 - 52.0 %   MCV 92.6 80.0 - 100.0 fL   MCH 29.6 26.0 - 34.0 pg   MCHC 31.9 30.0 - 36.0 g/dL   RDW 14.7 11.5 - 15.5 %   Platelets 167 150 - 400 K/uL   nRBC 0.1 0.0 - 0.2 %    Comment: Performed at [facility], [place] [address], [place], [place] [id number]  Basic metabolic panel     Status: Abnormal   Collection Time: 10/17/22  5:28 AM  Result Value Ref Range   Sodium 144 135 - 145 mmol/L   Potassium 3.6 3.5 - 5.1 mmol/L   Chloride 110 98 - 111 mmol/L   CO2 22 22 - 32 mmol/L   Glucose, Bld 166 (H) 70 - 99 mg/dL    Comment: Glucose reference range applies only to samples taken after fasting for at least 8 hours.   BUN 29 (H) 8 - 23 mg/dL   Creatinine, Ser 1.79 (H) 0.61 - 1.24 mg/dL   Calcium 8.4 (L) 8.9 - 10.3 mg/dL   GFR, Estimated 39 (L) >60 mL/min    Comment: (NOTE) Calculated using the CKD-EPI Creatinine Equation (2021)    Anion gap 12 5 - 15    Comment: Performed at  [facility], [place] [address] [place], [place] [address]  Magnesium     Status: None   Collection Time: 10/17/22  5:28 AM  Result Value Ref Range   Magnesium 1.8 1.7 - 2.4 mg/dL    Comment: Performed at [facility], [place] [address] [place],  [address]  Glucose, capillary     Status: Abnormal   Collection Time: 10/17/22  7:46 AM  Result Value Ref Range   Glucose-Capillary 155 (H) 70 - 99 mg/dL    Comment: Glucose reference range applies only to samples taken after fasting for at least 8 hours.  Glucose, capillary     Status: Abnormal   Collection Time: 10/17/22 11:36 AM  Result Value Ref Range   Glucose-Capillary 164 (H) 70 - 99 mg/dL    Comment: Glucose reference range applies only to samples taken after fasting for at least 8 hours.  Glucose, capillary     Status: Abnormal   Collection Time: 10/17/22  4:07 PM  Result Value Ref Range   Glucose-Capillary 181 (H) 70 - 99 mg/dL    Comment: Glucose reference range applies only to samples taken after fasting for at least 8 hours.  Glucose, capillary     Status: Abnormal   Collection Time: 10/17/22  7:53 PM  Result Value Ref Range   Glucose-Capillary 171 (H) 70 - 99 mg/dL    Comment: Glucose reference range applies only to samples taken after fasting for at least 8 hours.  Glucose, capillary     Status: Abnormal   Collection Time: 10/18/22 12:04 AM  Result Value Ref Range   Glucose-Capillary 155 (H) 70 - 99 mg/dL    Comment: Glucose reference range applies only to samples taken after fasting for at least 8 hours.  Glucose, capillary     Status: Abnormal   Collection Time: 10/18/22  3:44 AM  Result Value Ref Range   Glucose-Capillary 143 (H) 70 - 99 mg/dL    Comment: Glucose reference range applies only to samples taken after fasting for at least 8 hours.  CBC     Status: Abnormal   Collection Time: 10/18/22  4:32 AM  Result Value Ref Range   WBC 13.8 (H) 4.0 - 10.5 K/uL   RBC 3.01 (L)  4.22 - 5.81 MIL/uL   Hemoglobin 9.1 (L) 13.0 - 17.0 g/dL   HCT 27.6 (L) 39.0 - 52.0 %   MCV 91.7 80.0 - 100.0 fL   MCH 30.2 26.0 - 34.0 pg   MCHC 33.0 30.0 - 36.0 g/dL   RDW 14.6 11.5 - 15.5 %   Platelets 179 150 - 400 K/uL   nRBC 0.0 0.0 - 0.2 %    Comment: Performed at [facility], [place] [address], [place], [place] [address]  Magnesium     Status: None   Collection Time: 10/18/22  4:32 AM  Result Value Ref Range   Magnesium 2.3 1.7 - 2.4 mg/dL    Comment: Performed at [facility], [place] [address], [place], [place] [id number]  Basic metabolic panel     Status: Abnormal   Collection Time: 10/18/22  4:32 AM  Result Value Ref Range   Sodium 143 135 - 145 mmol/L   Potassium 3.5 3.5 - 5.1 mmol/L   Chloride 111 98 - 111 mmol/L   CO2 23 22 - 32 mmol/L   Glucose, Bld 143 (H) 70 - 99 mg/dL    Comment: Glucose reference range applies only to samples taken after fasting for at least 8 hours.   BUN 29 (H) 8 - 23 mg/dL   Creatinine, Ser 1.51 (H) 0.61 - 1.24 mg/dL   Calcium 8.4 (L) 8.9 - 10.3 mg/dL   GFR, Estimated 47 (L) >60 mL/min    Comment: (NOTE) Calculated using the CKD-EPI Creatinine Equation (2021)    Anion gap 9 5 - 15    Comment: Performed at [facility], [place] [address] [place], [place] [address]  Glucose, capillary     Status: Abnormal   Collection Time: 10/18/22  7:38 AM  Result Value Ref Range   Glucose-Capillary 136 (H) 70 - 99 mg/dL    Comment: Glucose reference range applies only to samples taken after fasting for at least 8 hours.    Imaging / Studies: DG Abd Portable 1V  Result Date: 10/16/2022 CLINICAL DATA:  [id number] Post-operative nausea and vomiting [id number] EXAM: PORTABLE ABDOMEN - 1 VIEW COMPARISON:  Radiograph 10/14/2022 FINDINGS: There are multiple dilated loops of small bowel, similar to prior exam. Patient underwent recent diagnostic laparoscopy with small-bowel resection on 10/14/2022. IMPRESSION:  Persistent multiple dilated loops of small bowel, most likely postoperative ileus, but possibly ongoing obstruction. Recommend radiographic follow-up. Electronically Signed   By: Maurine Simmering M.D.   On: 10/16/2022 11:39    Medications / Allergies: per chart  Antibiotics: Anti-infectives (From admission, onward)    Start     Dose/Rate Route Frequency Ordered Stop   10/14/22 1153  ceFAZolin (ANCEF) IVPB 2g/100 mL premix        2 g 200 mL/hr over 30 Minutes Intravenous On call to O.R. 10/14/22 1148 10/14/22 1326         Note: Portions of this report may have been transcribed using voice recognition software. Every effort was made to ensure accuracy; however, inadvertent computerized transcription errors may be present.   Any transcriptional  errors that result from this process are unintentional.    Adin Hector, MD, FACS, MASCRS Esophageal, Gastrointestinal & Colorectal Surgery Robotic and Minimally Invasive Surgery  [place]. [address], [place], [place] [address] [phone] Fax [phone] Main  CONTACT INFORMATION:  Weekday (9AM-5PM): Call [facility] main office at [phone]  Weeknight (5PM-9AM) or Weekend/Holiday: Check [email or web address] (password " [email or web address]") for General Surgery [facility] coverage  (Please, do not use SecureChat as it is not reliable communication to reach operating surgeons for immediate patient care given surgeries/outpatient duties/clinic/cross-coverage/off post-call which would lead to a delay in care.  Epic staff messaging available for outptient concerns, but may not be answered for 48 hours or more).     10/18/2022  9:05 AM

## 2022-10-18 NOTE — Progress Notes (Signed)
Mobility Specialist - Progress Note   10/18/22 1530  Mobility  Activity Ambulated with assistance in hallway  Level of Assistance Standby assist, set-up cues, supervision of patient - no hands on  Assistive Device Front wheel walker  Distance Ambulated (ft) 500 ft  Range of Motion/Exercises Active  Activity Response Tolerated well  $Mobility charge 1 Mobility   Pt was found in bed and agreeable to try ambulation. Pt wanting to try a couple steps without RW and ambulated ~57f and then decided to use RW. During ambulation stated having on going cramping that began after eating. [facility]. RN notified.  BFerd HibbsMobility Specialist

## 2022-10-18 NOTE — Progress Notes (Signed)
PROGRESS NOTE    Jacob Guerrero  [id number] DOB: 30-Aug-1945 DOA: 10/09/2022 PCP: Pcp, No    Chief Complaint  Patient presents with   Abdominal Pain    Brief Narrative:  Jacob Guerrero is a 77 y.o. male with medical history significant of CKD, DM2, HTN, Gout, Chronic bronchitis, GERD, glaucoma, RBBB, CAD with CABG and pacemaker, BPH, anemia, OA, HLD who presents for abdominal pain and found to have an SBO.  Patient was admitted to the floor started NG tube IV fluids and n.p.o. status.  NG tube came out 2 days ago overnight.  He is now not able to tolerate a diet due to abdominal pain.  He is nauseous surgery has been reconsulted.  Patient seen again by general surgery on 10/14/2022 who are recommending diagnostic laparoscopy and possible laparotomy as KUB done 10/14/2022 with increased small bowel dilatation consistent with obstruction.   Assessment & Plan:   Principal Problem:   SBO (small bowel obstruction) (HCC) Active Problems:   BPH (benign prostatic hyperplasia)   CAD (coronary artery disease)   CKD (chronic kidney disease) stage 3, GFR 30-59 ml/min (HCC)   Diabetes mellitus (HCC)   Essential hypertension   GERD (gastroesophageal reflux disease)   Gout   HLD (hyperlipidemia)  #1 partial small bowel obstruction secondary to small bowel mass per Diagnostic laparoscopy, laparoscopic assisted small bowel resection 10/14/2022/clinical postop ileus -Patient noted to have presented with abdominal pain, some nausea and vomiting CT abdomen and pelvis done concerning for small bowel obstruction with transition point in the right abdomen, short segment of wall thickening of the small bowel at the transition point. -KUB 10/13/2022 with increased number of moderately dilated small bowel loops favor progressive small bowel obstruction. -Patient was downgraded to clear liquids on 10/13/2022. -Patient initially seen by general surgery, patient improved clinically and then noted to  have passed the small bowel obstruction protocol with contrast noted in the colon. -Patient however noted to have minimal flatus, some loose stool minimal, ongoing pain, some slight increase in abdominal distention. -KUB done on the 04/06/2023 with increased small bowel dilatation concerning for distal small bowel obstruction. -Patient reassessed by general surgery who reviewed KUB films and recommended diagnostic laparoscopy and possible laparotomy which patient underwent on 10/14/2022 which showed a small bowel mass and patient underwent laparoscopic assisted small bowel resection.  Biopsies pending. -Patient with no emesis this morning, does have some abdominal distention, having bowel movements and feels postop ileus is slowly improving.   -Patient states tried oral intake this morning and had worsening abdominal pain.  -Diet has been advanced by general surgery to a dysphagia 1 diet.  -Mobilize.   -Supportive care.  -Per general surgery.  2.  AKI on CKD stage IIIb with metabolic acidosis -Likely secondary to prerenal azotemia in the setting of diuretics, secondary to SBO. -Patient hydrated with IV fluids with improving renal function, creatinine currently at 2.39. -Patient noted to have some episodes of emesis on 10/16/2022.  No further emesis noted. -Patient having bowel movements. -Renal for improved with hydration. -IV fluids saline locked..    3.  Hypokalemia/hypomagnesemia -Potassium at 3.5 this morning, magnesium at 2.3. -K-Dur 40 mEq p.o. x 1.  -Repeat labs in the AM.    4.  Type 2 diabetes mellitus -Hemoglobin A1c 7.1 (2/23/ 2024). -CBG 136 this morning.   -SSI.    5.  GERD -IV PPI changed to oral PPI.  6.  BPH -Continue home dose Proscar.  7.  History of  gout -Resume allopurinol.   8.  Hyperlipidemia -Continue to hold statin and resume on discharge.  9.  CAD -Was on IV beta-blocker and has been transition back to home dose oral Coreg.   -Continue to hold Entresto  and Lasix.  10.  Dysuria -Patient with complaints of dysuria postoperatively. -Urinalysis unremarkable.   -Supportive care.     DVT prophylaxis: Heparin Code Status: Full Family Communication: Updated patient.  No family at bedside. Disposition: Home when clinically improved and cleared by general surgery.  Status is: Inpatient Remains inpatient appropriate because: Severity of illness   Consultants:  General surgery: Dr. Barry Dienes 10/09/2022  Procedures:  CT abdomen and pelvis 10/09/2022 Abdominal films 10/11/2022, 10/12/2022, 10/13/2022, 10/14/2022, 10/16/2022 Small bowel obstruction protocol Diagnostic laparoscopy, laparoscopic assisted small bowel resection per general surgery: Dr. [name] 10/14/2022  Antimicrobials:  Anti-infectives (From admission, onward)    Start     Dose/Rate Route Frequency Ordered Stop   10/14/22 1153  ceFAZolin (ANCEF) IVPB 2g/100 mL premix        2 g 200 mL/hr over 30 Minutes Intravenous On call to O.R. 10/14/22 1148 10/14/22 1326         Subjective: Patient sleeping but arousable.  Denies any nausea or vomiting.  Passing flatus, having bowel movement.  States tried oral intake and did have some worsening abdominal pain however no emesis.   Objective: Vitals:   10/17/22 2201 10/18/22 0412 10/18/22 0743 10/18/22 1327  BP: 132/61 125/69  (!) 144/65  Pulse: 60 67  60  Resp: '16 15  16  '$ Temp: 97.9 F (36.6 C) 97.7 F (36.5 C)  97.9 F (36.6 C)  TempSrc: Oral Oral  Oral  SpO2: (!) 87% 97% 96% 100%  Weight:      Height:        Intake/Output Summary (Last 24 hours) at 10/18/2022 1405 Last data filed at 10/18/2022 1000 Gross per 24 hour  Intake 1155 ml  Output 120 ml  Net 1035 ml    Filed Weights   10/09/22 1224 10/14/22 1211  Weight: 92.5 kg 92.5 kg    Examination:  General exam: NAD Respiratory system: CTAB anterior lung fields.  No wheezes, no crackles, no rhonchi.  Fair air movement.  Speaking in full sentences.  No use of accessory  muscles of respiration.  Cardiovascular system: Regular rate rhythm no murmurs rubs or gallops.  No JVD.  No lower extremity edema.   Gastrointestinal system: Abdomen is soft, distended, some positive bowel sounds in the right lower quadrant.  Some diffuse tenderness to palpation. Incision sites c/d/I. Central nervous system: Alert and oriented.  Moving extremities spontaneously.  No focal neurological deficits.  Extremities: Symmetric 5 x 5 power. Skin: No rashes, lesions or ulcers Psychiatry: Judgement and insight appear normal. Mood & affect appropriate.     Data Reviewed: I have personally reviewed following labs and imaging studies  CBC: Recent Labs  Lab 10/14/22 0438 10/15/22 0437 10/16/22 0527 10/17/22 0528 10/18/22 0432  WBC 11.6* 16.2* 22.4* 19.2* 13.8*  NEUTROABS  --  12.7*  --   --   --   HGB 10.7* 10.8* 10.3* 9.2* 9.1*  HCT 33.2* 33.3* 32.1* 28.8* 27.6*  MCV 93.8 93.0 94.1 92.6 91.7  PLT 176 160 158 167 179     Basic Metabolic Panel: Recent Labs  Lab 10/14/22 0904 10/15/22 0437 10/16/22 0527 10/17/22 0528 10/18/22 0432  NA 144 141 140 144 143  K 3.4* 3.9 3.4* 3.6 3.5  CL 110 109 108  110 111  CO2 24 22 19* 22 23  GLUCOSE 178* 185* 199* 166* 143*  BUN 24* 24* 28* 29* 29*  CREATININE 2.05* 2.10* 2.39* 1.79* 1.51*  CALCIUM 8.8* 8.2* 8.3* 8.4* 8.4*  MG 1.6* 2.1 2.1 1.8 2.3     GFR: Estimated Creatinine Clearance: 50.3 mL/min (A) (by C-G formula based on SCr of 1.51 mg/dL (H)).  Liver Function Tests: Recent Labs  Lab 10/13/22 1414 10/14/22 0904  AST 11* 11*  ALT 8 8  ALKPHOS 44 45  BILITOT 0.6 0.6  PROT 6.5 6.5  ALBUMIN 3.2* 3.2*     CBG: Recent Labs  Lab 10/17/22 1953 10/18/22 0004 10/18/22 0344 10/18/22 0738 10/18/22 1159  GLUCAP 171* 155* 143* 136* 193*      Recent Results (from the past 240 hour(s))  Resp panel by RT-PCR (RSV, Flu A&B, Covid) Anterior Nasal Swab     Status: None   Collection Time: 10/09/22  1:14 PM    Specimen: Anterior Nasal Swab  Result Value Ref Range Status   SARS Coronavirus 2 by RT PCR NEGATIVE NEGATIVE Final    Comment: (NOTE) SARS-CoV-2 target nucleic acids are NOT DETECTED.  The SARS-CoV-2 RNA is generally detectable in upper respiratory specimens during the acute phase of infection. The lowest concentration of SARS-CoV-2 viral copies this assay can detect is 138 copies/mL. A negative result does not preclude SARS-Cov-2 infection and should not be used as the sole basis for treatment or other patient management decisions. A negative result may occur with  improper specimen collection/handling, submission of specimen other than nasopharyngeal swab, presence of viral mutation(s) within the areas targeted by this assay, and inadequate number of viral copies(<138 copies/mL). A negative result must be combined with clinical observations, patient history, and epidemiological information. The expected result is Negative.  Fact Sheet for Patients:  EntrepreneurPulse.com.au  Fact Sheet for Healthcare Providers:  IncredibleEmployment.be  This test is no t yet approved or cleared by the [place] FDA and  has been authorized for detection and/or diagnosis of SARS-CoV-2 by FDA under an Emergency Use Authorization (EUA). This EUA will remain  in effect (meaning this test can be used) for the duration of the COVID-19 declaration under Section 564(b)(1) of the Act, 21 U.S.C.section 360bbb-3(b)(1), unless the authorization is terminated  or revoked sooner.       Influenza A by PCR NEGATIVE NEGATIVE Final   Influenza B by PCR NEGATIVE NEGATIVE Final    Comment: (NOTE) The Xpert Xpress SARS-CoV-2/FLU/RSV plus assay is intended as an aid in the diagnosis of influenza from Nasopharyngeal swab specimens and should not be used as a sole basis for treatment. Nasal washings and aspirates are unacceptable for Xpert Xpress  SARS-CoV-2/FLU/RSV testing.  Fact Sheet for Patients: EntrepreneurPulse.com.au  Fact Sheet for Healthcare Providers: IncredibleEmployment.be  This test is not yet approved or cleared by the [place] FDA and has been authorized for detection and/or diagnosis of SARS-CoV-2 by FDA under an Emergency Use Authorization (EUA). This EUA will remain in effect (meaning this test can be used) for the duration of the COVID-19 declaration under Section 564(b)(1) of the Act, 21 U.S.C. section 360bbb-3(b)(1), unless the authorization is terminated or revoked.     Resp Syncytial Virus by PCR NEGATIVE NEGATIVE Final    Comment: (NOTE) Fact Sheet for Patients: EntrepreneurPulse.com.au  Fact Sheet for Healthcare Providers: IncredibleEmployment.be  This test is not yet approved or cleared by the [place] FDA and has been authorized for detection and/or diagnosis of  SARS-CoV-2 by FDA under an Emergency Use Authorization (EUA). This EUA will remain in effect (meaning this test can be used) for the duration of the COVID-19 declaration under Section 564(b)(1) of the Act, 21 U.S.C. section 360bbb-3(b)(1), unless the authorization is terminated or revoked.  Performed at [facility], [place] [address], [place], [place] [address]   Urine Culture     Status: None   Collection Time: 10/15/22  3:34 PM   Specimen: Urine, Clean Catch  Result Value Ref Range Status   Specimen Description   Final    URINE, CLEAN CATCH Performed at [facility], [place] [address], [place], [place] [address]    Special Requests   Final    NONE Performed at [facility], [place] [address], [place], [place] [address]    Culture   Final    NO GROWTH Performed at [facility], [place] [address] [place], [place] [address]    Report Status 10/17/2022 FINAL  Final         Radiology  Studies: No results found.      Scheduled Meds:  acetaminophen  1,000 mg Oral Q6H   allopurinol  300 mg Oral Daily   aspirin EC  81 mg Oral Daily   carvedilol  25 mg Oral BID   cholecalciferol  2,000 Units Oral Daily   dorzolamide  1 drop Both Eyes TID   feeding supplement  1 Container Oral BID BM   finasteride  5 mg Oral Daily   heparin injection (subcutaneous)  5,000 Units Subcutaneous Q8H   insulin aspart  0-15 Units Subcutaneous TID WC   lidocaine  1 patch Transdermal Q24H   lip balm   Topical BID   mometasone-formoterol  2 puff Inhalation BID   Netarsudil-Latanoprost  1 drop Both Eyes QHS   pantoprazole  40 mg Oral Q1200   polycarbophil  625 mg Oral BID   potassium chloride  40 mEq Oral Once   rosuvastatin  40 mg Oral Daily   sodium chloride flush  3 mL Intravenous Q12H   sodium chloride flush  3 mL Intravenous Q12H   Continuous Infusions:  sodium chloride 50 mL/hr at 10/17/22 1100   sodium chloride     lactated ringers       LOS: 9 days    Time spent: 35 minutes    Irine Seal, MD [facility]   To contact the attending provider between 7A-7P or the covering provider during after hours 7P-7A, please log into the web site [email or web address] and access using universal [place] password for that web site. If you do not have the password, please call the hospital operator.  10/18/2022, 2:05 PM

## 2022-10-19 DIAGNOSIS — K56609 Unspecified intestinal obstruction, unspecified as to partial versus complete obstruction: Secondary | ICD-10-CM | POA: Diagnosis not present

## 2022-10-19 DIAGNOSIS — I2585 Chronic coronary microvascular dysfunction: Secondary | ICD-10-CM | POA: Diagnosis not present

## 2022-10-19 DIAGNOSIS — I10 Essential (primary) hypertension: Secondary | ICD-10-CM | POA: Diagnosis not present

## 2022-10-19 DIAGNOSIS — N400 Enlarged prostate without lower urinary tract symptoms: Secondary | ICD-10-CM | POA: Diagnosis not present

## 2022-10-19 LAB — CBC WITH DIFFERENTIAL/PLATELET
Abs Immature Granulocytes: 0.21 10*3/uL — ABNORMAL HIGH (ref 0.00–0.07)
Basophils Absolute: 0 10*3/uL (ref 0.0–0.1)
Basophils Relative: 0 %
Eosinophils Absolute: 0.3 10*3/uL (ref 0.0–0.5)
Eosinophils Relative: 2 %
HCT: 28.7 % — ABNORMAL LOW (ref 39.0–52.0)
Hemoglobin: 9.1 g/dL — ABNORMAL LOW (ref 13.0–17.0)
Immature Granulocytes: 2 %
Lymphocytes Relative: 9 %
Lymphs Abs: 1.3 10*3/uL (ref 0.7–4.0)
MCH: 30.1 pg (ref 26.0–34.0)
MCHC: 31.7 g/dL (ref 30.0–36.0)
MCV: 95 fL (ref 80.0–100.0)
Monocytes Absolute: 1 10*3/uL (ref 0.1–1.0)
Monocytes Relative: 7 %
Neutro Abs: 10.8 10*3/uL — ABNORMAL HIGH (ref 1.7–7.7)
Neutrophils Relative %: 80 %
Platelets: 211 10*3/uL (ref 150–400)
RBC: 3.02 MIL/uL — ABNORMAL LOW (ref 4.22–5.81)
RDW: 14.9 % (ref 11.5–15.5)
WBC: 13.6 10*3/uL — ABNORMAL HIGH (ref 4.0–10.5)
nRBC: 0 % (ref 0.0–0.2)

## 2022-10-19 LAB — BASIC METABOLIC PANEL
Anion gap: 14 (ref 5–15)
BUN: 28 mg/dL — ABNORMAL HIGH (ref 8–23)
CO2: 19 mmol/L — ABNORMAL LOW (ref 22–32)
Calcium: 8.6 mg/dL — ABNORMAL LOW (ref 8.9–10.3)
Chloride: 110 mmol/L (ref 98–111)
Creatinine, Ser: 1.8 mg/dL — ABNORMAL HIGH (ref 0.61–1.24)
GFR, Estimated: 38 mL/min — ABNORMAL LOW (ref >60)
Glucose, Bld: 184 mg/dL — ABNORMAL HIGH (ref 70–99)
Potassium: 3.8 mmol/L (ref 3.5–5.1)
Sodium: 143 mmol/L (ref 135–145)

## 2022-10-19 LAB — GLUCOSE, CAPILLARY
Glucose-Capillary: 191 mg/dL — ABNORMAL HIGH (ref 70–99)
Glucose-Capillary: 168 mg/dL — ABNORMAL HIGH (ref 70–99)
Glucose-Capillary: 188 mg/dL — ABNORMAL HIGH (ref 70–99)
Glucose-Capillary: 194 mg/dL — ABNORMAL HIGH (ref 70–99)

## 2022-10-19 LAB — SURGICAL PATHOLOGY

## 2022-10-19 LAB — PHOSPHORUS: Phosphorus: 3.6 mg/dL (ref 2.5–4.6)

## 2022-10-19 LAB — MAGNESIUM: Magnesium: 1.9 mg/dL (ref 1.7–2.4)

## 2022-10-19 NOTE — Care Management Important Message (Signed)
Important Message  Patient Details IM Letter given. Name: Jacob Guerrero MRN: [id number] Date of Birth: October 02, 1945   Medicare Important Message Given:  Yes     Kerin Salen 10/19/2022, 1:37 PM

## 2022-10-19 NOTE — Progress Notes (Signed)
PROGRESS NOTE    Jacob Guerrero  [id number] DOB: [birth year] DOA: 10/09/2022 PCP: Pcp, No    Chief Complaint  Patient presents with   Abdominal Pain    Brief Narrative:  Jacob Guerrero is a 77 y.o. male with medical history significant of CKD, DM2, HTN, Gout, Chronic bronchitis, GERD, glaucoma, RBBB, CAD with CABG and pacemaker, BPH, anemia, OA, HLD who presents for abdominal pain and found to have an SBO.  Patient was admitted to the floor started NG tube IV fluids and n.p.o. status.  NG tube came out 2 days ago overnight.  He is now not able to tolerate a diet due to abdominal pain.  He is nauseous surgery has been reconsulted.  Patient seen again by general surgery on 10/14/2022 who are recommending diagnostic laparoscopy and possible laparotomy as KUB done 10/14/2022 with increased small bowel dilatation consistent with obstruction.   Assessment & Plan:   Principal Problem:   SBO (small bowel obstruction) (HCC) Active Problems:   BPH (benign prostatic hyperplasia)   CAD (coronary artery disease)   CKD (chronic kidney disease) stage 3, GFR 30-59 ml/min (HCC)   Diabetes mellitus (HCC)   Essential hypertension   GERD (gastroesophageal reflux disease)   Gout   HLD (hyperlipidemia)  #1 partial small bowel obstruction secondary to small bowel mass per Diagnostic laparoscopy, laparoscopic assisted small bowel resection 10/14/2022/clinical postop ileus -Patient noted to have presented with abdominal pain, some nausea and vomiting CT abdomen and pelvis done concerning for small bowel obstruction with transition point in the right abdomen, short segment of wall thickening of the small bowel at the transition point. -KUB 10/13/2022 with increased number of moderately dilated small bowel loops favor progressive small bowel obstruction. -Patient was downgraded to clear liquids on 10/13/2022. -Patient initially seen by general surgery, patient improved clinically and then noted to  have passed the small bowel obstruction protocol with contrast noted in the colon. -Patient however noted to have minimal flatus, some loose stool minimal, ongoing pain, some slight increase in abdominal distention. -KUB done on the 04/06/2023 with increased small bowel dilatation concerning for distal small bowel obstruction. -Patient reassessed by general surgery who reviewed KUB films and recommended diagnostic laparoscopy and possible laparotomy which patient underwent on 10/14/2022 which showed a small bowel mass and patient underwent laparoscopic assisted small bowel resection.  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  Margins were clear.  Lymphatic invasion present.  2 benign lymph nodes negative for tumor.  -Patient with clinical improvement, abdominal pain improved however still with some abdominal distention, having bowel movements.  Diet advanced to a soft diet which patient is tolerating.  -Likely needs outpatient follow-up with oncology however will defer to general surgery. -Mobilize.   -Supportive care.  -Per general surgery.  2.  AKI on CKD stage IIIb with metabolic acidosis -Likely secondary to prerenal azotemia in the setting of diuretics and Entresto, secondary to SBO. -Patient hydrated with IV fluids with improving renal function, creatinine currently at 1.80 -Patient noted to have some episodes of emesis on 10/16/2022.  No further emesis noted. -Patient having bowel movements. -Renal for improved with hydration. -IV fluids saline locked..    3.  Hypokalemia/hypomagnesemia -Potassium at 3.8 this morning, magnesium at 1.9.   -Repeat labs in the AM.    4.  Type 2 diabetes mellitus -Hemoglobin A1c 7.1 (2/23/ 2024). -CBG 191 this morning.   -SSI.    5.  GERD -PPI.    6.  BPH -Proscar.  7.  History of gout -Continue allopurinol.  8.  Hyperlipidemia -Continue to hold statin and resume on discharge.  9.  CAD -Was on IV  beta-blocker and has been transitioned back to home dose oral Coreg.   -Continue to hold Entresto and Lasix.  10.  Dysuria -Patient with complaints of dysuria postoperatively which have since improved.. -Urinalysis unremarkable.   -Supportive care.     DVT prophylaxis: Heparin Code Status: Full Family Communication: Updated patient.  No family at bedside. Disposition: Home when clinically improved and cleared by general surgery.  Status is: Inpatient Remains inpatient appropriate because: Severity of illness   Consultants:  General surgery: Dr. Barry Dienes 10/09/2022  Procedures:  CT abdomen and pelvis 10/09/2022 Abdominal films 10/11/2022, 10/12/2022, 10/13/2022, 10/14/2022, 10/16/2022 Small bowel obstruction protocol Diagnostic laparoscopy, laparoscopic assisted small bowel resection per general surgery: Dr. [name] 10/14/2022  Antimicrobials:  Anti-infectives (From admission, onward)    Start     Dose/Rate Route Frequency Ordered Stop   10/14/22 1153  ceFAZolin (ANCEF) IVPB 2g/100 mL premix        2 g 200 mL/hr over 30 Minutes Intravenous On call to O.R. 10/14/22 1148 10/14/22 1326         Subjective: Sitting up by the window.  On his phone.  Denies any chest pain or shortness of breath.  Having bowel movements.  Having flatus.  Stated ate solid food today and did not have any significant abdominal pain.  Overall feeling better today.    Objective: Vitals:   10/18/22 2027 10/19/22 0428 10/19/22 1357 10/19/22 2010  BP: (!) 153/80 (!) 125/51 (!) 143/65 (!) 151/62  Pulse: 71 66 75 69  Resp: '16 20 16 16  '$ Temp: 97.9 F (36.6 C) 98.6 F (37 C) 98.7 F (37.1 C) (!) 97.4 F (36.3 C)  TempSrc: Oral Oral Oral Oral  SpO2: 100% 97% 97% 99%  Weight:      Height:        Intake/Output Summary (Last 24 hours) at 10/19/2022 2017 Last data filed at 10/19/2022 1800 Gross per 24 hour  Intake 480 ml  Output --  Net 480 ml    Filed Weights   10/09/22 1224 10/14/22 1211  Weight: 92.5 kg  92.5 kg    Examination:  General exam: NAD Respiratory system: Lungs clear to auscultation bilaterally.  No wheezes, no crackles, no rhonchi.  Fair air movement.  Speaking in full sentences.  Cardiovascular system: RRR no murmurs rubs or gallops.  No JVD.  No lower extremity edema.  Gastrointestinal system: Abdomen is soft, distended, positive bowel sounds.  No significant diffuse tenderness to palpation.  No rebound.  No guarding.  Incision sites c/d/I. Central nervous system: Alert and oriented.  Moving extremities spontaneously.  No focal neurological deficits.  Extremities: Symmetric 5 x 5 power. Skin: No rashes, lesions or ulcers Psychiatry: Judgement and insight appear normal. Mood & affect appropriate.     Data Reviewed: I have personally reviewed following labs and imaging studies  CBC: Recent Labs  Lab 10/15/22 0437 10/16/22 0527 10/17/22 0528 10/18/22 0432 10/19/22 0425  WBC 16.2* 22.4* 19.2* 13.8* 13.6*  NEUTROABS 12.7*  --   --   --  10.8*  HGB 10.8* 10.3* 9.2* 9.1* 9.1*  HCT 33.3* 32.1* 28.8* 27.6* 28.7*  MCV 93.0 94.1 92.6 91.7 95.0  PLT 160 158 167 179 211     Basic Metabolic Panel: Recent Labs  Lab 10/15/22 0437 10/16/22 0527 10/17/22 0528 10/18/22 0432 10/19/22 0425  NA 141  140 144 143 143  K 3.9 3.4* 3.6 3.5 3.8  CL 109 108 110 111 110  CO2 22 19* 22 23 19*  GLUCOSE 185* 199* 166* 143* 184*  BUN 24* 28* 29* 29* 28*  CREATININE 2.10* 2.39* 1.79* 1.51* 1.80*  CALCIUM 8.2* 8.3* 8.4* 8.4* 8.6*  MG 2.1 2.1 1.8 2.3 1.9  PHOS  --   --   --   --  3.6     GFR: Estimated Creatinine Clearance: 42.2 mL/min (A) (by C-G formula based on SCr of 1.8 mg/dL (H)).  Liver Function Tests: Recent Labs  Lab 10/13/22 1414 10/14/22 0904  AST 11* 11*  ALT 8 8  ALKPHOS 44 45  BILITOT 0.6 0.6  PROT 6.5 6.5  ALBUMIN 3.2* 3.2*     CBG: Recent Labs  Lab 10/18/22 1956 10/18/22 2219 10/19/22 0738 10/19/22 1119 10/19/22 1626  GLUCAP 182* 196* 191* 168*  188*      Recent Results (from the past 240 hour(s))  Urine Culture     Status: None   Collection Time: 10/15/22  3:34 PM   Specimen: Urine, Clean Catch  Result Value Ref Range Status   Specimen Description   Final    URINE, CLEAN CATCH Performed at [facility], [place] [address], [place], [place] [address]    Special Requests   Final    NONE Performed at [facility], [place] [address] [place], [place] [address]    Culture   Final    NO GROWTH Performed at [facility], [place] [address] [place], [place] [address]    Report Status 10/17/2022 FINAL  Final         Radiology Studies: No results found.      Scheduled Meds:  acetaminophen  1,000 mg Oral Q6H   allopurinol  300 mg Oral Daily   aspirin EC  81 mg Oral Daily   carvedilol  25 mg Oral BID   cholecalciferol  2,000 Units Oral Daily   dorzolamide  1 drop Both Eyes TID   feeding supplement  1 Container Oral BID BM   finasteride  5 mg Oral Daily   heparin injection (subcutaneous)  5,000 Units Subcutaneous Q8H   insulin aspart  0-15 Units Subcutaneous TID WC   lidocaine  1 patch Transdermal Q24H   lip balm   Topical BID   mometasone-formoterol  2 puff Inhalation BID   Netarsudil-Latanoprost  1 drop Both Eyes QHS   pantoprazole  40 mg Oral Q1200   polycarbophil  625 mg Oral BID   rosuvastatin  40 mg Oral Daily   sodium chloride flush  3 mL Intravenous Q12H   sodium chloride flush  3 mL Intravenous Q12H   Continuous Infusions:  sodium chloride 50 mL/hr at 10/17/22 1100   sodium chloride       LOS: 10 days    Time spent: 35 minutes    Irine Seal, MD [facility]   To contact the attending provider between 7A-7P or the covering provider during after hours 7P-7A, please log into the web site [email or web address] and access using universal [place] password for that web site. If you do not have the password, please call the hospital operator.  10/19/2022,  8:17 PM

## 2022-10-19 NOTE — Progress Notes (Signed)
Mobility Specialist - Progress Note   10/19/22 1444  Mobility  Activity Ambulated with assistance in hallway;Ambulated independently in hallway  Level of Assistance Standby assist, set-up cues, supervision of patient - no hands on  Assistive Device Front wheel walker  Distance Ambulated (ft) 1000 ft  Activity Response Tolerated well  Mobility Referral Yes  $Mobility charge 1 Mobility   Pt received in bed and agreeable to mobility. After walking 558f pt opted out to take a ~537m seated rest break. C/o R hand swelling & feet were both swollen. Pt chose to walk back to room w/ out walker & was upset due to not being steady on feet. Nurse made aware of occurrence. Pt to bed after session with all needs met.   [facility]

## 2022-10-19 NOTE — Progress Notes (Signed)
Patient ID: Jacob Guerrero, male   DOB: 1945/08/23, 77 y.o.   MRN: [id number] [facility] Surgery Progress Note  5 Days Post-Op  Subjective: CC-  Reports flatus and frequent BMs. States when he drinks/eats he has some "rumbling" and discomfort and has to go to the bathroom pretty quickly. Denies blood in his stool. Denies nausea or emesis.   Objective: Vital signs in last 24 hours: Temp:  [97.9 F (36.6 C)-98.6 F (37 C)] 98.6 F (37 C) (03/04 0428) Pulse Rate:  [60-71] 66 (03/04 0428) Resp:  [16-20] 20 (03/04 0428) BP: (125-153)/(51-80) 125/51 (03/04 0428) SpO2:  [97 %-100 %] 97 % (03/04 0428) Last BM Date : 10/19/22  Intake/Output from previous day: 03/03 0701 - 03/04 0700 In: 840 [P.O.:840] Out: -  Intake/Output this shift: No intake/output data recorded.  PE: Gen:  Alert, appears uncomfortable Abd: soft, mild distention, appropriately tender, incisions c/d/I  Lab Results:  Recent Labs    10/18/22 0432 10/19/22 0425  WBC 13.8* 13.6*  HGB 9.1* 9.1*  HCT 27.6* 28.7*  PLT 179 211   BMET Recent Labs    10/18/22 0432 10/19/22 0425  NA 143 143  K 3.5 3.8  CL 111 110  CO2 23 19*  GLUCOSE 143* 184*  BUN 29* 28*  CREATININE 1.51* 1.80*  CALCIUM 8.4* 8.6*   PT/INR No results for input(s): "LABPROT", "INR" in the last 72 hours. CMP     Component Value Date/Time   NA 143 10/19/2022 0425   K 3.8 10/19/2022 0425   CL 110 10/19/2022 0425   CO2 19 (L) 10/19/2022 0425   GLUCOSE 184 (H) 10/19/2022 0425   BUN 28 (H) 10/19/2022 0425   CREATININE 1.80 (H) 10/19/2022 0425   CALCIUM 8.6 (L) 10/19/2022 0425   PROT 6.5 10/14/2022 0904   ALBUMIN 3.2 (L) 10/14/2022 0904   AST 11 (L) 10/14/2022 0904   ALT 8 10/14/2022 0904   ALKPHOS 45 10/14/2022 0904   BILITOT 0.6 10/14/2022 0904   GFRNONAA 38 (L) 10/19/2022 0425   Lipase     Component Value Date/Time   LIPASE 50 10/09/2022 1303       Studies/Results: No results  found.  Anti-infectives: Anti-infectives (From admission, onward)    Start     Dose/Rate Route Frequency Ordered Stop   10/14/22 1153  ceFAZolin (ANCEF) IVPB 2g/100 mL premix        2 g 200 mL/hr over 30 Minutes Intravenous On call to O.R. 10/14/22 1148 10/14/22 1326        Assessment/Plan SBO, small bowel mass -POD#5 s/p Diagnostic laparoscopy, laparoscopic-assisted small bowel resection 2/28 Dr. [name] - path pending - ileus improving, having bowel function. Advance to soft diet. - mobilize   ID - ancef periop FEN - Soft, IVF per primary VTE - SCDs, sq heparin Foley - none  AKI on CKD DM2 GERD BPH  Gout  HLD CAD Dysuria    LOS: 10 days    Jill Alexanders, [facility] Surgery 10/19/2022, 8:43 AM Please see Amion for pager number during day hours 7:00am-4:30pm

## 2022-10-20 DIAGNOSIS — K56609 Unspecified intestinal obstruction, unspecified as to partial versus complete obstruction: Secondary | ICD-10-CM | POA: Diagnosis not present

## 2022-10-20 DIAGNOSIS — N1832 Chronic kidney disease, stage 3b: Secondary | ICD-10-CM

## 2022-10-20 DIAGNOSIS — D3A019 Benign carcinoid tumor of the small intestine, unspecified portion: Secondary | ICD-10-CM

## 2022-10-20 DIAGNOSIS — I2585 Chronic coronary microvascular dysfunction: Secondary | ICD-10-CM | POA: Diagnosis not present

## 2022-10-20 DIAGNOSIS — N400 Enlarged prostate without lower urinary tract symptoms: Secondary | ICD-10-CM | POA: Diagnosis not present

## 2022-10-20 LAB — CBC WITH DIFFERENTIAL/PLATELET
Abs Immature Granulocytes: 0.26 10*3/uL — ABNORMAL HIGH (ref 0.00–0.07)
Basophils Absolute: 0 10*3/uL (ref 0.0–0.1)
Basophils Relative: 0 %
Eosinophils Absolute: 0.2 10*3/uL (ref 0.0–0.5)
Eosinophils Relative: 1 %
HCT: 30.6 % — ABNORMAL LOW (ref 39.0–52.0)
Hemoglobin: 9.7 g/dL — ABNORMAL LOW (ref 13.0–17.0)
Immature Granulocytes: 2 %
Lymphocytes Relative: 10 %
Lymphs Abs: 1.4 10*3/uL (ref 0.7–4.0)
MCH: 30 pg (ref 26.0–34.0)
MCHC: 31.7 g/dL (ref 30.0–36.0)
MCV: 94.7 fL (ref 80.0–100.0)
Monocytes Absolute: 1 10*3/uL (ref 0.1–1.0)
Monocytes Relative: 7 %
Neutro Abs: 11.7 10*3/uL — ABNORMAL HIGH (ref 1.7–7.7)
Neutrophils Relative %: 80 %
Platelets: 245 10*3/uL (ref 150–400)
RBC: 3.23 MIL/uL — ABNORMAL LOW (ref 4.22–5.81)
RDW: 15 % (ref 11.5–15.5)
WBC: 14.6 10*3/uL — ABNORMAL HIGH (ref 4.0–10.5)
nRBC: 0 % (ref 0.0–0.2)

## 2022-10-20 LAB — RENAL FUNCTION PANEL
Albumin: 2.5 g/dL — ABNORMAL LOW (ref 3.5–5.0)
Anion gap: 9 (ref 5–15)
BUN: 24 mg/dL — ABNORMAL HIGH (ref 8–23)
CO2: 22 mmol/L (ref 22–32)
Calcium: 8.6 mg/dL — ABNORMAL LOW (ref 8.9–10.3)
Chloride: 110 mmol/L (ref 98–111)
Creatinine, Ser: 1.81 mg/dL — ABNORMAL HIGH (ref 0.61–1.24)
GFR, Estimated: 38 mL/min — ABNORMAL LOW (ref >60)
Glucose, Bld: 207 mg/dL — ABNORMAL HIGH (ref 70–99)
Phosphorus: 3.5 mg/dL (ref 2.5–4.6)
Potassium: 3.5 mmol/L (ref 3.5–5.1)
Sodium: 141 mmol/L (ref 135–145)

## 2022-10-20 LAB — GLUCOSE, CAPILLARY
Glucose-Capillary: 192 mg/dL — ABNORMAL HIGH (ref 70–99)
Glucose-Capillary: 238 mg/dL — ABNORMAL HIGH (ref 70–99)

## 2022-10-20 LAB — MAGNESIUM: Magnesium: 1.6 mg/dL — ABNORMAL LOW (ref 1.7–2.4)

## 2022-10-20 MED ORDER — FUROSEMIDE 40 MG PO TABS
40.0000 mg | ORAL_TABLET | Freq: Every day | ORAL | Status: DC
  Administered 2022-10-20: 40 mg via ORAL
  Filled 2022-10-20: qty 1

## 2022-10-20 MED ORDER — MAGNESIUM SULFATE 4 GM/100ML IV SOLN
4.0000 g | Freq: Once | INTRAVENOUS | Status: AC
  Administered 2022-10-20: 4 g via INTRAVENOUS
  Filled 2022-10-20: qty 100

## 2022-10-20 MED ORDER — CALCIUM POLYCARBOPHIL 625 MG PO TABS: 625.0000 mg | ORAL_TABLET | Freq: Two times a day (BID) | ORAL | Status: AC

## 2022-10-20 MED ORDER — POTASSIUM CHLORIDE CRYS ER 10 MEQ PO TBCR
40.0000 meq | EXTENDED_RELEASE_TABLET | Freq: Once | ORAL | Status: AC
  Administered 2022-10-20: 40 meq via ORAL
  Filled 2022-10-20: qty 4

## 2022-10-20 MED ORDER — ENTRESTO 24-26 MG PO TABS: 1.0000 | ORAL_TABLET | Freq: Two times a day (BID) | ORAL | Status: AC

## 2022-10-20 NOTE — Progress Notes (Signed)
Discharge instructions given to patient and all questions were answered.  

## 2022-10-20 NOTE — Progress Notes (Signed)
Inpatient Diabetes Program Recommendations  AACE/ADA: New Consensus Statement on Inpatient Glycemic Control (2015)  Target Ranges:  Prepandial:   less than 140 mg/dL      Peak postprandial:   less than 180 mg/dL (1-2 hours)      Critically ill patients:  140 - 180 mg/dL   Lab Results  Component Value Date   GLUCAP 238 (H) 10/20/2022   HGBA1C 7.1 (H) 10/09/2022    Review of Glycemic Control  Latest Reference Range & Units 10/19/22 11:19 10/19/22 16:26 10/19/22 21:49 10/20/22 07:27 10/20/22 11:31  Glucose-Capillary 70 - 99 mg/dL 168 (H) 188 (H) 194 (H) 192 (H) 238 (H)   Diabetes history: DM 2 Outpatient Diabetes medications:  Glucotrol 10 mg daily, Janumet 50-500 mg bid Current orders for Inpatient glycemic control:  Novolog moderate tid with meals  Inpatient Diabetes Program Recommendations:    Note blood sugars starting to increase. May consider resumption of oral DM medications at discharge?    Thanks,  Adah Perl, RN, BC-ADM Inpatient Diabetes Coordinator Pager [phone]  (8a-5p)

## 2022-10-20 NOTE — Discharge Summary (Signed)
Physician Discharge Summary  Brodrick Fieldhouse [id number] DOB: June 07, 1946 DOA: 10/09/2022  PCP: Pcp, No  Admit date: 10/09/2022 Discharge date: 10/20/2022  Time spent: 60 minutes  Recommendations for Outpatient Follow-up:  Follow-up with Dr. [name], general surgery on 11/10/2022 at 9:20 AM for postop follow-up. Follow-up with PCP in 2 weeks.  On follow-up patient will need basic metabolic profile done to follow-up on electrolytes and renal function.  Patient will need referral to local oncologist for further evaluation and management of carcinoid tumor   Discharge Diagnoses:  Principal Problem:   SBO (small bowel obstruction) ([place]) Active Problems:   BPH (benign prostatic hyperplasia)   CAD (coronary artery disease)   CKD (chronic kidney disease) stage 3, GFR 30-59 ml/min (HCC)   Diabetes mellitus ([place])   Essential hypertension   GERD (gastroesophageal reflux disease)   Gout   HLD (hyperlipidemia)   Carcinoid tumor of small intestine   Discharge Condition: Stable and improved.  Diet recommendation: Heart healthy  Filed Weights   10/09/22 1224 10/14/22 1211  Weight: 92.5 kg 92.5 kg    History of present illness:  HPI per Dr. Despina Hick Krystal Muratalla is a 77 y.o. male with medical history significant of CKD, DM2, HTN, Gout, Chronic bronchitis, GERD, glaucoma, RBBB, CAD with CABG and pacemaker, BPH, anemia, OA, HLD who presents for abdominal pain and found to have an SBO.  He was travelling yesterday with his girlfriend from [place] to visit his brother who lives around this area.  Last night, after travelling, he developed abdominal pain which was sharp, above the belly button and relatively constant.  He had emesis (non bloody) and bowel movements which would briefly improve the pain, but otherwise it was constant.  He noticed no blood loss.  Nothing else made the pain better.  He notes no preceding illness, no sick contacts.  He ate mostly fruit on the drive down  here and did not feel that it was very out of the norm for him.  Further symptoms include dizziness when standing.  This will happen often, but not everytime he stands up.  He will feel dizzy and hot and feel like the room is darkening.  It will pass if he gives it time.     ED Course: In the ED, he was found to have a WBC of 14.4, H/H of 11 and 35, Na of 134, bicarb of 19, glucose of 289, BUN of 63, Cr of 3.45 (BUN/Cr ratio is 18).  CT abdomen showed an SBO with possible SB inflammation.  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  He had an NGT placed and surgery was consulted.  Surgery will do a gastrograffin follow through.    Hospital Course:  #1 partial small bowel obstruction secondary to small bowel mass (carcinoid tumor) per Diagnostic laparoscopy, laparoscopic assisted small bowel resection 10/14/2022/clinical postop ileus -Patient noted to have presented with abdominal pain, some nausea and vomiting CT abdomen and pelvis done concerning for small bowel obstruction with transition point in the right abdomen, short segment of wall thickening of the small bowel at the transition point. -KUB 10/13/2022 with increased number of moderately dilated small bowel loops favor progressive small bowel obstruction. -Patient was downgraded to clear liquids on 10/13/2022. -Patient initially seen by general surgery, patient improved clinically and then noted to have passed the small bowel obstruction protocol with contrast noted in the colon. -Patient however noted to have minimal flatus, some loose stool minimal, ongoing pain, some slight  increase in abdominal distention. -KUB done on the 04/06/2023 with increased small bowel dilatation concerning for distal small bowel obstruction. -Patient reassessed by general surgery who reviewed KUB films and recommended diagnostic laparoscopy and possible laparotomy which patient underwent on 10/14/2022 which showed a small bowel mass and patient underwent laparoscopic  assisted small bowel resection.   -Biopsies consistent with well-differentiated neuroendocrine tumor/carcinoid tumor.  Margins were clear.  Lymphatic invasion present.  2 benign lymph nodes negative for tumor.  -Patient with clinical improvement, abdominal pain improved, abdominal distention improved and patient was having bowel movements, passing flatus had no further nausea or vomiting.  -Patient followed by general surgery diet advanced to a soft diet which patient tolerated.   -Case discussed with oncology on-call, and he was noted that patient preferred to get all his treatment back in his hometown of [place] and as such patient will need to follow-up with PCP for referral to local oncologist for further evaluation and management of carcinoid tumor. -Patient improved clinically, cleared by general surgery for discharge and patient will follow-up with general surgery in the outpatient setting.   2.  AKI on CKD stage IIIb with metabolic acidosis -Likely secondary to prerenal azotemia in the setting of diuretics and Entresto, secondary to SBO. -Patient hydrated with IV fluids with improved renal function, creatinine stabilized at 1.81 by day of discharge.  -Patient noted to have some episodes of emesis on 10/16/2022.  No further emesis noted. -Patient having bowel movements. -Renal for improved with hydration. -Patient's Lasix will be resumed on day of discharge and Entresto to be resumed 2 to 3 days postdischarge. -Outpatient follow-up with PCP.   3.  Hypokalemia/hypomagnesemia -Repleted during the hospitalization.  -Outpatient follow-up.    4.  Type 2 diabetes mellitus -Hemoglobin A1c 7.1 (2/23/ 2024). -Patient's oral hypoglycemic agents were held during the hospitalization the patient maintained on sliding scale insulin.   -Oral hypoglycemic agents will be resumed on discharge.   -Outpatient follow-up with PCP.     5.  GERD -Patient maintained on PPI during the hospitalization.      6.  BPH -Patient maintained on home regimen Proscar.    7.  History of gout -Patient maintained on allopurinol once he was started on a diet..     8.  Hyperlipidemia -Statin held during the hospitalization and will be resumed on discharge.    9.  CAD -Was on IV beta-blocker while on bowel rest and transition back to home regimen Coreg.   -Lasix subsequently resumed.   Delene Loll will be resumed 2 to 3 days postdischarge.    10.  Dysuria -Patient with complaints of dysuria postoperatively which have since improved.. -Urinalysis unremarkable.   -Supportive care.        Procedures: CT abdomen and pelvis 10/09/2022 Abdominal films 10/11/2022, 10/12/2022, 10/13/2022, 10/14/2022, 10/16/2022 Small bowel obstruction protocol Diagnostic laparoscopy, laparoscopic assisted small bowel resection per general surgery: Dr. [name] 10/14/2022  Consultations: General surgery: Dr. Barry Dienes 10/09/2022    Discharge Exam: Vitals:   10/20/22 0908 10/20/22 1307  BP:  (!) 135/51  Pulse:  61  Resp:  16  Temp:  97.8 F (36.6 C)  SpO2: 96% 97%    General: NAD Cardiovascular: RRR no murmurs rubs or gallops.  No JVD.  Trace pedal edema.  Respiratory: Lungs clear to auscultation bilaterally.  No wheezes, no crackles, no rhonchi.  Fair air movement.  Speaking in full sentences.  Discharge Instructions   Discharge Instructions     Diet - low sodium  heart healthy   Complete by: As directed    Increase activity slowly   Complete by: As directed       Allergies as of 10/20/2022       Reactions   Brimonidine Other (See Comments)    follicular conj   Penicillins Other (See Comments)   Unknown         Medication List     TAKE these medications    acetaminophen 500 MG tablet Commonly known as: TYLENOL Take 1,000 mg by mouth 2 (two) times daily as needed for moderate pain.   albuterol 108 (90 Base) MCG/ACT inhaler Commonly known as: VENTOLIN HFA Inhale 2 puffs into the lungs as needed for  wheezing or shortness of breath.   albuterol (2.5 MG/3ML) 0.083% nebulizer solution Commonly known as: PROVENTIL Take 2.5 mg by nebulization as needed for wheezing or shortness of breath.   allopurinol 300 MG tablet Commonly known as: ZYLOPRIM Take 300 mg by mouth daily.   Aspirin Low Dose 81 MG tablet Generic drug: aspirin EC Take 81 mg by mouth daily.   carvedilol 25 MG tablet Commonly known as: COREG Take 25 mg by mouth 2 (two) times daily.   Crestor 40 MG tablet Generic drug: rosuvastatin Take 40 mg by mouth daily.   diclofenac Sodium 1 % Gel Commonly known as: VOLTAREN Apply 1 Application topically as needed (pain).   dorzolamide 2 % ophthalmic solution Commonly known as: TRUSOPT Place 1 drop into both eyes 3 (three) times daily.   Entresto 24-26 MG Generic drug: sacubitril-valsartan Take 1 tablet by mouth 2 (two) times daily. Start taking on: October 22, 2022 What changed: These instructions start on October 22, 2022. If you are unsure what to do until then, ask your doctor or other care provider.   fexofenadine 180 MG tablet Commonly known as: ALLEGRA Take 180 mg by mouth as needed for allergies.   finasteride 5 MG tablet Commonly known as: PROSCAR Take 5 mg by mouth daily.   Flonase Allergy Relief 50 MCG/ACT nasal spray Generic drug: fluticasone Place 2 sprays into both nostrils as needed for allergies or rhinitis.   Flovent HFA 110 MCG/ACT inhaler Generic drug: fluticasone Inhale 1-2 puffs into the lungs as needed (wheezing/SOB).   fluticasone-salmeterol 230-21 MCG/ACT inhaler Commonly known as: ADVAIR HFA Inhale 2 puffs into the lungs 2 (two) times daily.   furosemide 40 MG tablet Commonly known as: LASIX Take 40 mg by mouth daily.   glipiZIDE 10 MG 24 hr tablet Commonly known as: GLUCOTROL XL Take 10 mg by mouth daily.   Janumet 50-500 MG tablet Generic drug: sitaGLIPtin-metformin Take 1 tablet by mouth 2 (two) times daily with a meal.    Netarsudil-Latanoprost 0.02-0.005 % Soln Place 1 drop into both eyes at bedtime.   nitroGLYCERIN 0.4 MG SL tablet Commonly known as: NITROSTAT Place 0.4 mg under the tongue every 5 (five) minutes as needed for chest pain.   omeprazole 40 MG capsule Commonly known as: PRILOSEC Take 40 mg by mouth daily.   polycarbophil 625 MG tablet Commonly known as: FIBERCON Take 1 tablet (625 mg total) by mouth 2 (two) times daily.   sildenafil 50 MG tablet Commonly known as: VIAGRA Take 50 mg by mouth as needed for erectile dysfunction.   Vitamin D3 Super Strength 50 MCG (2000 UT) Tabs Generic drug: Cholecalciferol Take 2,000 Units by mouth daily.       Allergies  Allergen Reactions   Brimonidine Other (See Comments)  follicular conj   Penicillins Other (See Comments)    Unknown     Follow-up Information     Dwan Bolt, MD. Go on 11/10/2022.   Specialty: General Surgery Why: at 9 :20 AM for post-operative follow up, Arrive 37mn early to check in, fill out paperwork, BEngineer, civil (consulting)ID and insurance information Contact information: [place] [phone]        PCP. Schedule an appointment as soon as possible for a visit in 2 week(s).                   The results of significant diagnostics from this hospitalization (including imaging, microbiology, ancillary and laboratory) are listed below for reference.    Significant Diagnostic Studies: DG Abd Portable 1V  Result Date: 10/16/2022 CLINICAL DATA:  [id number] Post-operative nausea and vomiting [id number] EXAM: PORTABLE ABDOMEN - 1 VIEW COMPARISON:  Radiograph 10/14/2022 FINDINGS: There are multiple dilated loops of small bowel, similar to prior exam. Patient underwent recent diagnostic laparoscopy with small-bowel resection on 10/14/2022. IMPRESSION: Persistent multiple dilated loops of small bowel, most likely postoperative ileus, but possibly ongoing obstruction. Recommend  radiographic follow-up. Electronically Signed   By: JMaurine SimmeringM.D.   On: 10/16/2022 11:39   DG Abd 1 View  Result Date: 10/14/2022 CLINICAL DATA:  Abdominal pain. EXAM: ABDOMEN - 1 VIEW COMPARISON:  October 13, 2022. FINDINGS: Increased small bowel dilatation is noted concerning for distal small bowel obstruction. No colonic dilatation is noted. IMPRESSION: Increased small bowel dilatation is noted concerning for distal small bowel obstruction. Electronically Signed   By: JMarijo ConceptionM.D.   On: 10/14/2022 08:15   DG Abd 1 View  Result Date: 10/13/2022 CLINICAL DATA:  Abdominal pain EXAM: ABDOMEN - 1 VIEW COMPARISON:  Yesterday FINDINGS: 2 portable radiographs, presumably both supine. Pacer/ICD is incompletely imaged. Increased number of dilated small bowel loops. Example loop at 3.6 cm, similar. Normal caliber colon. Low pelvis excluded. No gross free intraperitoneal air. IMPRESSION: Increased number of moderately dilated small bowel loops. Favor progressive small-bowel obstruction. Electronically Signed   By: KAbigail MiyamotoM.D.   On: 10/13/2022 08:23   DG Abd 1 View  Result Date: 10/12/2022 CLINICAL DATA:  [id number] pain [id number]: ABDOMEN - 1 VIEW COMPARISON:  KUB, 10/11/2022.  CT AP, 10/09/2022. FINDINGS: Support lines: AICD lead, incompletely imaged. Similar appearance and degree of distention of dilated central bowel loop. Nonobstructed colon. No intraperitoneal air. No interval osseous abnormality. IMPRESSION: Similar abdominal findings with dilated central bowel loop Electronically Signed   By: JMichaelle BirksM.D.   On: 10/12/2022 08:05   DG Abd Portable 1V-Small Bowel Obstruction Protocol-initial, 8 hr delay  Result Date: 10/11/2022 CLINICAL DATA:  Small bowel protocol, 8 hour delay. EXAM: PORTABLE ABDOMEN - 1 VIEW COMPARISON:  Radiographs earlier today. FINDINGS: Administered enteric contrast is seen throughout the colon. There is persisting gaseous distention of small bowel  centrally. No evidence of free air. IMPRESSION: 1. Administered enteric contrast throughout the colon. 2. Persistent gaseous distention of small bowel centrally. Electronically Signed   By: MKeith RakeM.D.   On: 10/11/2022 23:05   DG Abd Portable 1V  Result Date: 10/11/2022 CLINICAL DATA:  Small bowel obstruction. EXAM: PORTABLE ABDOMEN - 1 VIEW COMPARISON:  Radiograph yesterday FINDINGS: Removal of enteric tube. Persisting gaseous distention of small bowel in the left abdomen, although slightly improved from yesterday's exam. No obvious free air on the supine views.  No other interval change from prior. IMPRESSION: Persisting gaseous distention of small bowel in the left abdomen, although slightly improved from yesterday's exam. Removal of enteric tube. Electronically Signed   By: Keith Rake M.D.   On: 10/11/2022 12:19   DG Abd Portable 1V-Small Bowel Obstruction Protocol-initial, 8 hr delay  Result Date: 10/10/2022 CLINICAL DATA:  Small-bowel obstruction EXAM: PORTABLE ABDOMEN - 1 VIEW COMPARISON:  None Available. FINDINGS: There is a nasogastric tube with tip and side port in the stomach. There is a small amount of intragastric contrast material. No distal contrast. Mildly dilated small bowel in the left hemiabdomen. IMPRESSION: 1. Mildly dilated small bowel in the left hemiabdomen. 2. Small amount of intragastric contrast material. No distal contrast. Electronically Signed   By: Ulyses Jarred M.D.   On: 10/10/2022 02:52   DG Abd Portable 1V-Small Bowel Protocol-Position Verification  Result Date: 10/09/2022 CLINICAL DATA:  Tube placement EXAM: PORTABLE ABDOMEN - 1 VIEW limited for tube placement COMPARISON:  None Available. FINDINGS: Limited x-ray of the upper abdomen and lower chest were NG tube placement demonstrates tube overlying the fundus of the stomach. Side hole beneath the diaphragm. Elsewhere there are some dilated loops of small bowel in the left upper quadrant of the abdomen.  Defibrillator. IMPRESSION: Limited x-ray for tube placement has tip overlying the fundus of the stomach Electronically Signed   By: Jill Side M.D.   On: 10/09/2022 17:34   CT ABDOMEN PELVIS WO CONTRAST  Result Date: 10/09/2022 CLINICAL DATA:  Acute abdominal pain. Patient reports pain and distension since last night. Nausea, vomiting, diarrhea. EXAM: CT ABDOMEN AND PELVIS WITHOUT CONTRAST TECHNIQUE: Multidetector CT imaging of the abdomen and pelvis was performed following the standard protocol without IV contrast. RADIATION DOSE REDUCTION: This exam was performed according to the departmental dose-optimization program which includes automated exposure control, adjustment of the mA and/or kV according to patient size and/or use of iterative reconstruction technique. COMPARISON:  None Available. FINDINGS: Lower chest: Pacemaker wires partially visualized, mild cardiomegaly. Trace right pleural effusion. Hepatobiliary: Subcentimeter subcapsular low-density in the posterior right hepatic lobe is too small to accurately characterize but likely small cyst. No evidence of solid liver lesion on this unenhanced exam. Gallbladder physiologically distended, no calcified stone. No biliary dilatation. Pancreas: No ductal dilatation or inflammation. Spleen: Normal in size without focal abnormality. Adrenals/Urinary Tract: No adrenal nodule. There is slight adrenal thickening on the left. Bilateral renal parenchymal thinning. No hydronephrosis or renal calculi. No evidence of solid renal lesion. Both ureters are decompressed. The urinary bladder is partially distended, grossly normal for degree of distension. Stomach/Bowel: Stomach is partially distended with fluid. Small bowel is dilated and fluid-filled. There is fecalization of small bowel contents within small bowel loop in the right abdomen. Transition from dilated to nondilated small bowel just distal to the fecalized segment where there is short segment of wall  thickening. Transition on series 2, image 55 and series 5, image 34. There is trace mesenteric edema and free fluid but no small bowel pneumatosis, free air or perforation. Small bowel distal to the transition point is decompressed. Suspected diverticulosis of the appendix but no appendicitis or diverticulitis. Additional diverticular seen about the cecum. Majority of the colon is nondistended. No colonic inflammatory change. Vascular/Lymphatic: Aortic atherosclerosis without aneurysm. No portal venous or mesenteric gas. No bulky abdominopelvic adenopathy. Reproductive: Mild prostate enlargement with mass effect on the bladder base. Other: Mesenteric edema with small amount of mesenteric free fluid in the small bowel mesentery.  No perforation, free air or focal fluid collection. There is fat within both inguinal canals. Musculoskeletal: There are 4 non-rib-bearing lumbar vertebra. Moderate lower lumbar facet hypertrophy. Bilateral hip osteoarthritis. There are no acute or suspicious osseous abnormalities. IMPRESSION: 1. Small bowel obstruction with transition point in the right abdomen. There is short segment of wall thickening of small bowel at the transition point. This may be due to adhesions if there is history of prior abdominal surgery inflammation, however the possibility of small bowel stricture, neoplasm or focal enteritis is also considered. 2. Minimal ascending colonic diverticulosis. There is also a diverticular changes of the appendix. No acute diverticulitis or appendicitis. 3. Mild prostate enlargement with mass effect on the bladder base. 4. Trace right pleural effusion. Aortic Atherosclerosis (ICD10-I70.0). Electronically Signed   By: Keith Rake M.D.   On: 10/09/2022 15:43    Microbiology: Recent Results (from the past 240 hour(s))  Urine Culture     Status: None   Collection Time: 10/15/22  3:34 PM   Specimen: Urine, Clean Catch  Result Value Ref Range Status   Specimen Description    Final    URINE, CLEAN CATCH Performed at [facility], [place] [address] [place], [place] [address]    Special Requests   Final    NONE Performed at [facility], [place] [address], [place], [place] [address]    Culture   Final    NO GROWTH Performed at [facility], [place] [address], [place], [place] [address]    Report Status 10/17/2022 FINAL  Final     Labs: Basic Metabolic Panel: Recent Labs  Lab 10/16/22 0527 10/17/22 0528 10/18/22 0432 10/19/22 0425 10/20/22 0436  NA 140 144 143 143 141  K 3.4* 3.6 3.5 3.8 3.5  CL 108 110 111 110 110  CO2 19* 22 23 19* 22  GLUCOSE 199* 166* 143* 184* 207*  BUN 28* 29* 29* 28* 24*  CREATININE 2.39* 1.79* 1.51* 1.80* 1.81*  CALCIUM 8.3* 8.4* 8.4* 8.6* 8.6*  MG 2.1 1.8 2.3 1.9 1.6*  PHOS  --   --   --  3.6 3.5   Liver Function Tests: Recent Labs  Lab 10/14/22 0904 10/20/22 0436  AST 11*  --   ALT 8  --   ALKPHOS 45  --   BILITOT 0.6  --   PROT 6.5  --   ALBUMIN 3.2* 2.5*   No results for input(s): "LIPASE", "AMYLASE" in the last 168 hours. No results for input(s): "AMMONIA" in the last 168 hours. CBC: Recent Labs  Lab 10/15/22 0437 10/16/22 0527 10/17/22 0528 10/18/22 0432 10/19/22 0425 10/20/22 0436  WBC 16.2* 22.4* 19.2* 13.8* 13.6* 14.6*  NEUTROABS 12.7*  --   --   --  10.8* 11.7*  HGB 10.8* 10.3* 9.2* 9.1* 9.1* 9.7*  HCT 33.3* 32.1* 28.8* 27.6* 28.7* 30.6*  MCV 93.0 94.1 92.6 91.7 95.0 94.7  PLT 160 158 167 179 211 245   Cardiac Enzymes: No results for input(s): "CKTOTAL", "CKMB", "CKMBINDEX", "TROPONINI" in the last 168 hours. BNP: BNP (last 3 results) Recent Labs    10/09/22 1303  BNP 51.0    ProBNP (last 3 results) No results for input(s): "PROBNP" in the last 8760 hours.  CBG: Recent Labs  Lab 10/19/22 1119 10/19/22 1626 10/19/22 2149 10/20/22 0727 10/20/22 1131  GLUCAP 168* 188* 194* 192* 238*       Signed:  Irine Seal MD.  [facility] 10/20/2022, 3:44 PM

## 2022-10-20 NOTE — Progress Notes (Signed)
Patient ID: Jacob Guerrero, male   DOB: [birth year], 77 y.o.   MRN: [id number] [facility] Surgery Progress Note  6 Days Post-Op  Subjective: CC-  Reports flatus and frequent BMs. Denies nausea or vomiting, does have mild abdominal distention. Mobilizing. Tolerating PO but does not enjoy the hospital food.   Objective: Vital signs in last 24 hours: Temp:  [97.4 F (36.3 C)-98.7 F (37.1 C)] 98.1 F (36.7 C) (03/05 0702) Pulse Rate:  [63-75] 63 (03/05 0702) Resp:  [16] 16 (03/05 0702) BP: (143-154)/(62-65) 154/64 (03/05 0702) SpO2:  [96 %-99 %] 96 % (03/05 0908) Last BM Date : 10/19/22  Intake/Output from previous day: 03/04 0701 - 03/05 0700 In: 533 [P.O.:530; I.V.:3] Out: -  Intake/Output this shift: No intake/output data recorded.  PE: Gen:  Alert, appears uncomfortable Abd: soft, mild distention, appropriately tender, incisions c/d/I  Lab Results:  Recent Labs    10/19/22 0425 10/20/22 0436  WBC 13.6* 14.6*  HGB 9.1* 9.7*  HCT 28.7* 30.6*  PLT 211 245   BMET Recent Labs    10/19/22 0425 10/20/22 0436  NA 143 141  K 3.8 3.5  CL 110 110  CO2 19* 22  GLUCOSE 184* 207*  BUN 28* 24*  CREATININE 1.80* 1.81*  CALCIUM 8.6* 8.6*   PT/INR No results for input(s): "LABPROT", "INR" in the last 72 hours. CMP     Component Value Date/Time   NA 141 10/20/2022 0436   K 3.5 10/20/2022 0436   CL 110 10/20/2022 0436   CO2 22 10/20/2022 0436   GLUCOSE 207 (H) 10/20/2022 0436   BUN 24 (H) 10/20/2022 0436   CREATININE 1.81 (H) 10/20/2022 0436   CALCIUM 8.6 (L) 10/20/2022 0436   PROT 6.5 10/14/2022 0904   ALBUMIN 2.5 (L) 10/20/2022 0436   AST 11 (L) 10/14/2022 0904   ALT 8 10/14/2022 0904   ALKPHOS 45 10/14/2022 0904   BILITOT 0.6 10/14/2022 0904   GFRNONAA 38 (L) 10/20/2022 0436   Lipase     Component Value Date/Time   LIPASE 50 10/09/2022 1303       Studies/Results: No results found.  Anti-infectives: Anti-infectives (From admission,  onward)    Start     Dose/Rate Route Frequency Ordered Stop   10/14/22 1153  ceFAZolin (ANCEF) IVPB 2g/100 mL premix        2 g 200 mL/hr over 30 Minutes Intravenous On call to O.R. 10/14/22 1148 10/14/22 1326        Assessment/Plan SBO, small bowel mass -POD#6 s/p Diagnostic laparoscopy, laparoscopic-assisted small bowel resection 2/28 Dr. [name] - path: well-differentiated neuroendocrine tumor, 0/2 nodes  - ileus improving, having bowel function. Continue soft diet  - mobilize - stable for discharge from [facility] standpoint. Our office arranging follow up with Dr. [name]. Patient requests oncology/PCP follow up in [place] where he lives.  ID - ancef periop FEN - Soft, IVF per primary VTE - SCDs, sq heparin Foley - none  AKI on CKD DM2 GERD BPH  Gout  HLD CAD Dysuria    LOS: 11 days    Jill Alexanders, [facility] Surgery 10/20/2022, 9:13 AM Please see Amion for pager number during day hours 7:00am-4:30pm

## 2022-10-28 ENCOUNTER — Other Ambulatory Visit: Payer: Self-pay

## 2022-10-28 NOTE — Progress Notes (Signed)
The proposed treatment discussed in conference is for discussion purpose only and is not a binding recommendation.  The patients have not been physically examined, or presented with their treatment options.  Therefore, final treatment plans cannot be decided.  

## 2023-05-27 IMAGING — CR DG CHEST 2V
2 series · 2 of 2 positions shown · non-contrast
Comparison: None.

CLINICAL DATA: Cough for 4 weeks.  Short of breath last night.

EXAM:
CHEST - 2 VIEW

[w chest pa]
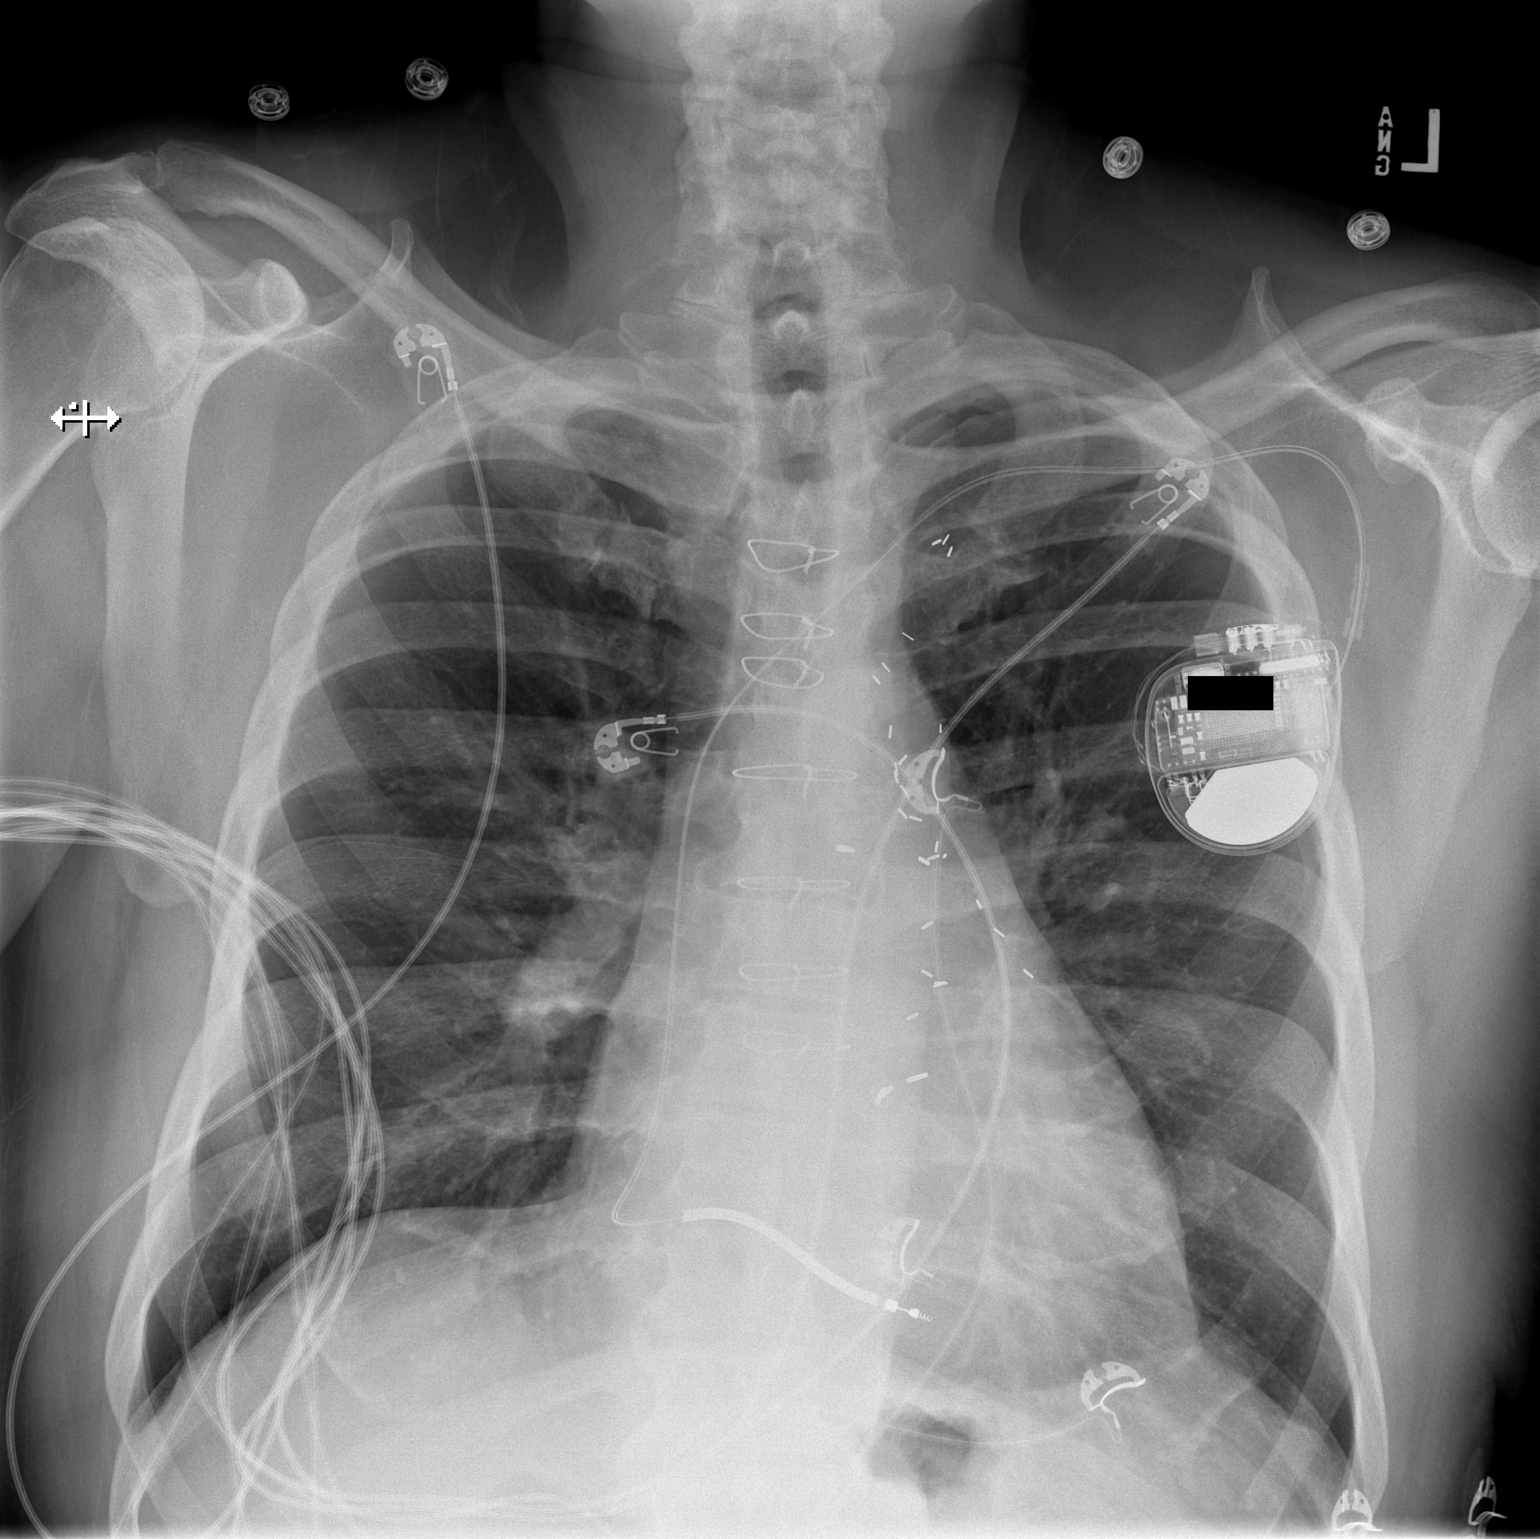

[w chest lat]
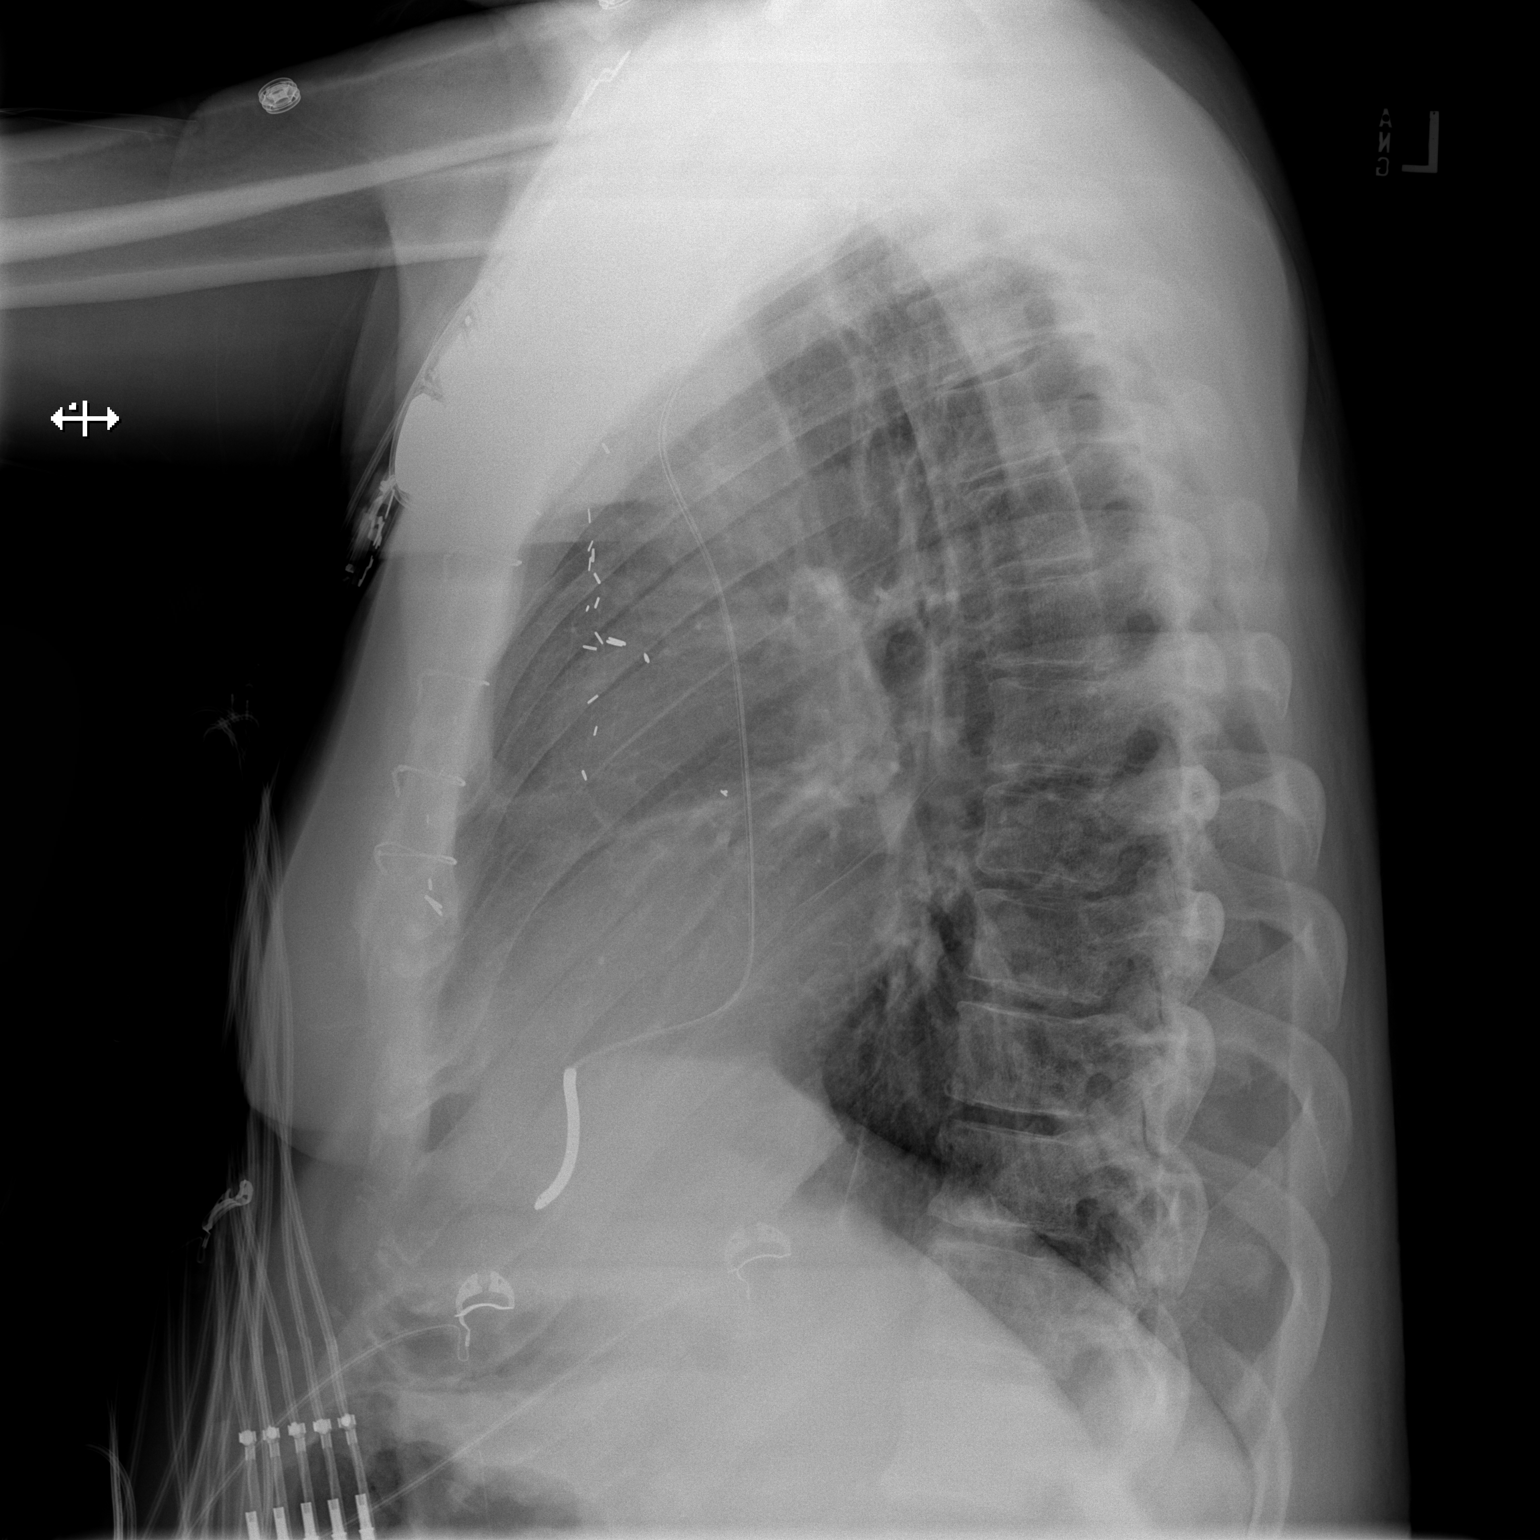

[2 of 2 positions shown; findings below may reference images not displayed]

FINDINGS: There changes from previous cardiac surgery. Cardiac silhouette is
normal in size. Single lead left anterior chest wall AICD, well
positioned. Normal mediastinal and hilar contours.

Clear lungs.  No pleural effusion or pneumothorax.

Skeletal structures are intact.
IMPRESSION: No active cardiopulmonary disease.
